# Patient Record
Sex: Female | Born: 1945 | Race: White | Hispanic: No | Smoking: Never smoker
Health system: Southern US, Community
[De-identification: ages and names within clinical notes are randomized; demographics above are authoritative.]

## PROBLEM LIST (undated history)

## (undated) DIAGNOSIS — E7800 Pure hypercholesterolemia, unspecified: Secondary | ICD-10-CM

## (undated) DIAGNOSIS — I10 Essential (primary) hypertension: Secondary | ICD-10-CM

## (undated) DIAGNOSIS — J45909 Unspecified asthma, uncomplicated: Secondary | ICD-10-CM

## (undated) DIAGNOSIS — E119 Type 2 diabetes mellitus without complications: Secondary | ICD-10-CM

## (undated) DIAGNOSIS — C50919 Malignant neoplasm of unspecified site of unspecified female breast: Secondary | ICD-10-CM

## (undated) DIAGNOSIS — Z808 Family history of malignant neoplasm of other organs or systems: Secondary | ICD-10-CM

## (undated) DIAGNOSIS — Z803 Family history of malignant neoplasm of breast: Secondary | ICD-10-CM

## (undated) DIAGNOSIS — Z801 Family history of malignant neoplasm of trachea, bronchus and lung: Secondary | ICD-10-CM

## (undated) DIAGNOSIS — Z800 Family history of malignant neoplasm of digestive organs: Secondary | ICD-10-CM

## (undated) DIAGNOSIS — M810 Age-related osteoporosis without current pathological fracture: Secondary | ICD-10-CM

## (undated) HISTORY — DX: Type 2 diabetes mellitus without complications: E11.9

## (undated) HISTORY — DX: Family history of malignant neoplasm of breast: Z80.3

## (undated) HISTORY — DX: Age-related osteoporosis without current pathological fracture: M81.0

## (undated) HISTORY — DX: Essential (primary) hypertension: I10

## (undated) HISTORY — DX: Family history of malignant neoplasm of digestive organs: Z80.0

## (undated) HISTORY — DX: Family history of malignant neoplasm of trachea, bronchus and lung: Z80.1

## (undated) HISTORY — PX: TONSILLECTOMY: SUR1361

## (undated) HISTORY — DX: Family history of malignant neoplasm of other organs or systems: Z80.8

## (undated) HISTORY — PX: MASTECTOMY: SHX3

---

## 1998-08-01 ENCOUNTER — Other Ambulatory Visit: Admission: RE | Admit: 1998-08-01 | Discharge: 1998-08-01 | Payer: Self-pay | Admitting: Obstetrics and Gynecology

## 1999-04-27 ENCOUNTER — Other Ambulatory Visit: Admission: RE | Admit: 1999-04-27 | Discharge: 1999-04-27 | Payer: Self-pay | Admitting: Obstetrics and Gynecology

## 2000-12-09 ENCOUNTER — Encounter: Payer: Self-pay | Admitting: Family Medicine

## 2000-12-09 ENCOUNTER — Ambulatory Visit (HOSPITAL_COMMUNITY): Admission: RE | Admit: 2000-12-09 | Discharge: 2000-12-09 | Payer: Self-pay | Admitting: Family Medicine

## 2001-01-01 ENCOUNTER — Encounter: Payer: Self-pay | Admitting: Obstetrics and Gynecology

## 2001-01-05 ENCOUNTER — Encounter (INDEPENDENT_AMBULATORY_CARE_PROVIDER_SITE_OTHER): Payer: Self-pay

## 2001-01-05 ENCOUNTER — Ambulatory Visit (HOSPITAL_COMMUNITY): Admission: RE | Admit: 2001-01-05 | Discharge: 2001-01-05 | Payer: Self-pay | Admitting: Obstetrics and Gynecology

## 2001-12-29 ENCOUNTER — Other Ambulatory Visit: Admission: RE | Admit: 2001-12-29 | Discharge: 2001-12-29 | Payer: Self-pay | Admitting: Obstetrics and Gynecology

## 2003-02-16 ENCOUNTER — Other Ambulatory Visit: Admission: RE | Admit: 2003-02-16 | Discharge: 2003-02-16 | Payer: Self-pay | Admitting: Obstetrics and Gynecology

## 2004-02-20 ENCOUNTER — Other Ambulatory Visit: Admission: RE | Admit: 2004-02-20 | Discharge: 2004-02-20 | Payer: Self-pay | Admitting: Obstetrics and Gynecology

## 2005-04-24 ENCOUNTER — Other Ambulatory Visit: Admission: RE | Admit: 2005-04-24 | Discharge: 2005-04-24 | Payer: Self-pay | Admitting: Obstetrics and Gynecology

## 2006-10-13 ENCOUNTER — Ambulatory Visit: Payer: Self-pay | Admitting: Oncology

## 2006-11-27 ENCOUNTER — Ambulatory Visit: Payer: Self-pay | Admitting: Oncology

## 2006-12-01 LAB — COMPREHENSIVE METABOLIC PANEL
CO2: 27 mEq/L (ref 19–32)
Creatinine, Ser: 1.19 mg/dL (ref 0.40–1.20)
Glucose, Bld: 91 mg/dL (ref 70–99)
Total Bilirubin: 0.4 mg/dL (ref 0.3–1.2)

## 2006-12-01 LAB — CBC WITH DIFFERENTIAL/PLATELET
BASO%: 0.4 % (ref 0.0–2.0)
Basophils Absolute: 0 10*3/uL (ref 0.0–0.1)
EOS%: 3.9 % (ref 0.0–7.0)
Eosinophils Absolute: 0.3 10*3/uL (ref 0.0–0.5)
HCT: 40.4 % (ref 34.8–46.6)
HGB: 14.4 g/dL (ref 11.6–15.9)
LYMPH%: 22.2 % (ref 14.0–48.0)
MONO#: 0.8 10*3/uL (ref 0.1–0.9)
MONO%: 10.4 % (ref 0.0–13.0)
NEUT#: 4.8 10*3/uL (ref 1.5–6.5)
NEUT%: 63.1 % (ref 39.6–76.8)
RDW: 12 % (ref 11.3–14.5)
WBC: 7.5 10*3/uL (ref 3.9–10.0)
lymph#: 1.7 10*3/uL (ref 0.9–3.3)

## 2006-12-01 LAB — LACTATE DEHYDROGENASE: LDH: 198 U/L (ref 94–250)

## 2006-12-09 ENCOUNTER — Emergency Department (HOSPITAL_COMMUNITY): Admission: EM | Admit: 2006-12-09 | Discharge: 2006-12-09 | Payer: Self-pay | Admitting: Emergency Medicine

## 2007-04-01 ENCOUNTER — Ambulatory Visit: Payer: Self-pay | Admitting: Oncology

## 2007-04-03 LAB — CBC WITH DIFFERENTIAL/PLATELET
BASO%: 0.5 % (ref 0.0–2.0)
Basophils Absolute: 0 10*3/uL (ref 0.0–0.1)
HCT: 40.2 % (ref 34.8–46.6)
HGB: 14.4 g/dL (ref 11.6–15.9)
MONO#: 0.6 10*3/uL (ref 0.1–0.9)
NEUT%: 60 % (ref 39.6–76.8)
WBC: 7 10*3/uL (ref 3.9–10.0)
lymph#: 1.8 10*3/uL (ref 0.9–3.3)

## 2007-04-03 LAB — LACTATE DEHYDROGENASE: LDH: 205 U/L (ref 94–250)

## 2007-04-03 LAB — MORPHOLOGY: PLT EST: ADEQUATE

## 2007-04-03 LAB — COMPREHENSIVE METABOLIC PANEL
ALT: 22 U/L (ref 0–35)
CO2: 21 mEq/L (ref 19–32)
Chloride: 107 mEq/L (ref 96–112)
Sodium: 142 mEq/L (ref 135–145)
Total Bilirubin: 0.4 mg/dL (ref 0.3–1.2)
Total Protein: 7.7 g/dL (ref 6.0–8.3)

## 2007-06-24 ENCOUNTER — Ambulatory Visit: Payer: Self-pay | Admitting: Oncology

## 2008-03-17 ENCOUNTER — Ambulatory Visit: Payer: Self-pay | Admitting: Oncology

## 2008-03-21 LAB — CBC WITH DIFFERENTIAL/PLATELET
BASO%: 0.3 % (ref 0.0–2.0)
Basophils Absolute: 0 10*3/uL (ref 0.0–0.1)
HCT: 41.8 % (ref 34.8–46.6)
HGB: 14.6 g/dL (ref 11.6–15.9)
LYMPH%: 22.9 % (ref 14.0–48.0)
MCH: 31.1 pg (ref 26.0–34.0)
MCHC: 34.9 g/dL (ref 32.0–36.0)
MONO#: 0.9 10*3/uL (ref 0.1–0.9)
NEUT%: 58.9 % (ref 39.6–76.8)
Platelets: 246 10*3/uL (ref 145–400)
WBC: 8.9 10*3/uL (ref 3.9–10.0)

## 2008-03-21 LAB — COMPREHENSIVE METABOLIC PANEL
ALT: 23 U/L (ref 0–35)
BUN: 25 mg/dL — ABNORMAL HIGH (ref 6–23)
CO2: 24 mEq/L (ref 19–32)
Calcium: 9.9 mg/dL (ref 8.4–10.5)
Creatinine, Ser: 0.8 mg/dL (ref 0.40–1.20)
Glucose, Bld: 87 mg/dL (ref 70–99)
Total Bilirubin: 0.4 mg/dL (ref 0.3–1.2)

## 2008-03-21 LAB — LACTATE DEHYDROGENASE: LDH: 212 U/L (ref 94–250)

## 2009-03-30 ENCOUNTER — Ambulatory Visit: Payer: Self-pay | Admitting: Oncology

## 2009-04-03 LAB — COMPREHENSIVE METABOLIC PANEL
ALT: 22 U/L (ref 0–35)
AST: 16 U/L (ref 0–37)
Albumin: 4.8 g/dL (ref 3.5–5.2)
Alkaline Phosphatase: 70 U/L (ref 39–117)
BUN: 17 mg/dL (ref 6–23)
CO2: 22 mEq/L (ref 19–32)
Calcium: 9.6 mg/dL (ref 8.4–10.5)
Chloride: 106 mEq/L (ref 96–112)
Creatinine, Ser: 0.7 mg/dL (ref 0.40–1.20)
Glucose, Bld: 98 mg/dL (ref 70–99)
Potassium: 4 mEq/L (ref 3.5–5.3)
Sodium: 141 mEq/L (ref 135–145)
Total Bilirubin: 0.3 mg/dL (ref 0.3–1.2)
Total Protein: 7.3 g/dL (ref 6.0–8.3)

## 2009-04-03 LAB — CBC WITH DIFFERENTIAL/PLATELET
BASO%: 0.4 % (ref 0.0–2.0)
Basophils Absolute: 0 10*3/uL (ref 0.0–0.1)
EOS%: 6.5 % (ref 0.0–7.0)
Eosinophils Absolute: 0.5 10*3/uL (ref 0.0–0.5)
HCT: 41.2 % (ref 34.8–46.6)
HGB: 14.2 g/dL (ref 11.6–15.9)
LYMPH%: 23.6 % (ref 14.0–49.7)
MCH: 30.9 pg (ref 25.1–34.0)
MCHC: 34.4 g/dL (ref 31.5–36.0)
MCV: 89.8 fL (ref 79.5–101.0)
MONO#: 0.7 10*3/uL (ref 0.1–0.9)
MONO%: 9.2 % (ref 0.0–14.0)
NEUT#: 4.5 10*3/uL (ref 1.5–6.5)
NEUT%: 60.3 % (ref 38.4–76.8)
Platelets: 248 10*3/uL (ref 145–400)
RBC: 4.59 10*6/uL (ref 3.70–5.45)
RDW: 11.9 % (ref 11.2–14.5)
WBC: 7.4 10*3/uL (ref 3.9–10.3)
lymph#: 1.8 10*3/uL (ref 0.9–3.3)

## 2010-04-25 ENCOUNTER — Ambulatory Visit: Payer: Self-pay | Admitting: Oncology

## 2010-04-27 LAB — COMPREHENSIVE METABOLIC PANEL
ALT: 25 U/L (ref 0–35)
AST: 18 U/L (ref 0–37)
Albumin: 4.8 g/dL (ref 3.5–5.2)
Alkaline Phosphatase: 78 U/L (ref 39–117)
BUN: 17 mg/dL (ref 6–23)
CO2: 20 mEq/L (ref 19–32)
Calcium: 10.1 mg/dL (ref 8.4–10.5)
Chloride: 106 mEq/L (ref 96–112)
Creatinine, Ser: 0.71 mg/dL (ref 0.40–1.20)
Glucose, Bld: 98 mg/dL (ref 70–99)
Potassium: 4.3 mEq/L (ref 3.5–5.3)
Sodium: 139 mEq/L (ref 135–145)
Total Bilirubin: 0.4 mg/dL (ref 0.3–1.2)
Total Protein: 7.1 g/dL (ref 6.0–8.3)

## 2010-04-27 LAB — CBC WITH DIFFERENTIAL/PLATELET
BASO%: 0.2 % (ref 0.0–2.0)
Basophils Absolute: 0 10*3/uL (ref 0.0–0.1)
EOS%: 4.8 % (ref 0.0–7.0)
Eosinophils Absolute: 0.3 10*3/uL (ref 0.0–0.5)
HCT: 40.5 % (ref 34.8–46.6)
HGB: 14.1 g/dL (ref 11.6–15.9)
LYMPH%: 23.3 % (ref 14.0–49.7)
MCH: 31.4 pg (ref 25.1–34.0)
MCHC: 34.9 g/dL (ref 31.5–36.0)
MCV: 90 fL (ref 79.5–101.0)
MONO#: 0.8 10*3/uL (ref 0.1–0.9)
MONO%: 12.6 % (ref 0.0–14.0)
NEUT#: 3.9 10*3/uL (ref 1.5–6.5)
NEUT%: 59.1 % (ref 38.4–76.8)
Platelets: 238 10*3/uL (ref 145–400)
RBC: 4.5 10*6/uL (ref 3.70–5.45)
RDW: 12.5 % (ref 11.2–14.5)
WBC: 6.6 10*3/uL (ref 3.9–10.3)
lymph#: 1.5 10*3/uL (ref 0.9–3.3)

## 2010-04-27 LAB — LACTATE DEHYDROGENASE: LDH: 202 U/L (ref 94–250)

## 2010-12-07 NOTE — Op Note (Signed)
[facility]  Patient:    Courtney Livingston, Courtney Livingston          MRN: [id number] Proc. Date: 01/05/01 Attending:  Trevor Iha, M.D.                           Operative Report  PREOPERATIVE DIAGNOSIS:       Postmenopausal bleeding and endometrial mass.  POSTOPERATIVE DIAGNOSIS:      Postmenopausal bleeding and endometrial mass.  OPERATION:                    Hysteroscopy dilatation and curettage.  SURGEON:                      Trevor Iha, M.D.  ANESTHESIA:                   Monitored anesthesia care and paracervical block.  ESTIMATED BLOOD LOSS:         Less than 10 cc.  SORBITOL DEFICIT:             Approximately 0.  INDICATIONS:                  [name] is a 65 year old postmenopausal white female on tamoxifen for breast cancer who presents after having one week of vaginal bleeding.  Ultrasound report shows thickened endometrial stripe and endometrial echogenic mass.  She presents today for hysteroscopy D&C for evaluation of this.  FINDINGS:                     Mildly thickened endometrial normal-appearing ostia on left and right, probable submucosal fibroid.  Otherwise normal-appearing cervix and endometrium after D&C.  DESCRIPTION OF PROCEDURE:     After adequate analgesia, the patient was placed in the dorsolithotomy position.  She was thoroughly prepped and draped.  The bladder was sterilely drained.  Graves speculum was placed.  Paracervical block was placed with 1% Xylocaine.  Tenaculum was placed on the anterior lip of the cervix.  The cervix was dilated up to a #27 tapered dilator. Hysteroscope was inserted.  The above findings were noted which is mostly mildly thickened endometrium.  Curettage was performed.  Reexamination at this point revealed minimal residual endometrium, bulging of the superior anterior fundus consistent with a submucosal fibroid.  No residual polyps were identified, and otherwise endometrial  lining appeared to be benign.  At this time, the hysteroscope was removed.  The tenaculum was removed from the anterior lip of the cervix.  Hemostasis was achieved with direct pressure and silver nitrate.  Speculum was then removed.  The patient tolerated the procedure well and was stable on transfer to the recovery room.  Sponge and instrument counts normal x 3.  Estimated blood loss for the procedure was less than 10 cc.  There was minimal sorbitol deficit.  DISPOSITION:  The patient is discharged to home for followup in the office in two to three weeks.  She was given routine instruction sheet for D&C. Discharge condition is good. DD:  01/05/01 TD:  01/05/01 Job: [id number]

## 2011-05-02 ENCOUNTER — Other Ambulatory Visit: Payer: Self-pay | Admitting: Oncology

## 2011-05-02 ENCOUNTER — Encounter (HOSPITAL_BASED_OUTPATIENT_CLINIC_OR_DEPARTMENT_OTHER): Payer: BC Managed Care – PPO | Admitting: Oncology

## 2011-05-02 DIAGNOSIS — C50419 Malignant neoplasm of upper-outer quadrant of unspecified female breast: Secondary | ICD-10-CM

## 2011-05-02 LAB — CBC WITH DIFFERENTIAL/PLATELET
BASO%: 0.3 % (ref 0.0–2.0)
EOS%: 3.2 % (ref 0.0–7.0)
HCT: 43.4 % (ref 34.8–46.6)
LYMPH%: 18.2 % (ref 14.0–49.7)
MCH: 31 pg (ref 25.1–34.0)
MCHC: 34.2 g/dL (ref 31.5–36.0)
MCV: 90.7 fL (ref 79.5–101.0)
NEUT%: 68.3 % (ref 38.4–76.8)
Platelets: 273 10*3/uL (ref 145–400)

## 2011-05-02 LAB — COMPREHENSIVE METABOLIC PANEL
ALT: 28 U/L (ref 0–35)
AST: 18 U/L (ref 0–37)
Creatinine, Ser: 1 mg/dL (ref 0.50–1.10)
Total Bilirubin: 0.2 mg/dL — ABNORMAL LOW (ref 0.3–1.2)

## 2011-05-02 LAB — LACTATE DEHYDROGENASE: LDH: 210 U/L (ref 94–250)

## 2011-05-14 ENCOUNTER — Encounter (HOSPITAL_BASED_OUTPATIENT_CLINIC_OR_DEPARTMENT_OTHER): Payer: BC Managed Care – PPO | Admitting: Oncology

## 2011-05-14 DIAGNOSIS — Z9010 Acquired absence of unspecified breast and nipple: Secondary | ICD-10-CM

## 2011-05-14 DIAGNOSIS — Z79811 Long term (current) use of aromatase inhibitors: Secondary | ICD-10-CM

## 2011-05-14 DIAGNOSIS — M810 Age-related osteoporosis without current pathological fracture: Secondary | ICD-10-CM

## 2011-05-14 DIAGNOSIS — Z853 Personal history of malignant neoplasm of breast: Secondary | ICD-10-CM

## 2012-01-16 ENCOUNTER — Encounter (HOSPITAL_COMMUNITY): Payer: Self-pay | Admitting: Emergency Medicine

## 2012-01-16 ENCOUNTER — Emergency Department (HOSPITAL_COMMUNITY)
Admission: EM | Admit: 2012-01-16 | Discharge: 2012-01-16 | Disposition: A | Discharge status: Home / Self Care | Payer: Medicare Other | Attending: Emergency Medicine | Admitting: Emergency Medicine

## 2012-01-16 DIAGNOSIS — Z9010 Acquired absence of unspecified breast and nipple: Secondary | ICD-10-CM | POA: Insufficient documentation

## 2012-01-16 DIAGNOSIS — Z853 Personal history of malignant neoplasm of breast: Secondary | ICD-10-CM | POA: Insufficient documentation

## 2012-01-16 DIAGNOSIS — H43392 Other vitreous opacities, left eye: Secondary | ICD-10-CM

## 2012-01-16 DIAGNOSIS — I10 Essential (primary) hypertension: Secondary | ICD-10-CM | POA: Insufficient documentation

## 2012-01-16 DIAGNOSIS — J45909 Unspecified asthma, uncomplicated: Secondary | ICD-10-CM | POA: Insufficient documentation

## 2012-01-16 DIAGNOSIS — H43399 Other vitreous opacities, unspecified eye: Secondary | ICD-10-CM | POA: Insufficient documentation

## 2012-01-16 DIAGNOSIS — E7800 Pure hypercholesterolemia, unspecified: Secondary | ICD-10-CM | POA: Insufficient documentation

## 2012-01-16 HISTORY — DX: Essential (primary) hypertension: I10

## 2012-01-16 HISTORY — DX: Unspecified asthma, uncomplicated: J45.909

## 2012-01-16 HISTORY — DX: Pure hypercholesterolemia, unspecified: E78.00

## 2012-01-16 HISTORY — DX: Malignant neoplasm of unspecified site of unspecified female breast: C50.919

## 2012-01-16 MED ORDER — CYCLOPENTOLATE HCL 1 % OP SOLN
2.0000 [drp] | Freq: Once | OPHTHALMIC | Status: AC
  Administered 2012-01-16: 2 [drp] via OPHTHALMIC
  Filled 2012-01-16: qty 2

## 2012-01-16 NOTE — ED Notes (Signed)
Pt states that she is due for new eyeglasses next week--has an appointment on Wednesday. Pt was wearing eyeglasses during visual acuity screening.

## 2012-01-16 NOTE — ED Notes (Signed)
Pt reports that she was seen by an optometrist today because of "left eye floater"--- her eye was dilated for exam, was discharged with info that floater will go away but if she sees flashes of light, she needs to seek emergency care. Pt states that she started seeing "bright flashes of light" at around 9pm tonight.

## 2012-01-16 NOTE — ED Provider Notes (Signed)
History     CSN: [id number]  Arrival date & time 01/16/12  0122   First MD Initiated Contact with Patient 01/16/12 [phone]      Chief Complaint  Patient presents with  . Eye Problem   HPI  History provided by the patient. Patient is a 66 year old female with history of hypertension hypercholesterolemia who presents with complaints of left eye vision changes. Patient states that she is having floaters across eye and vision with bright flashing lights to the upper left quadrant. Patient states that she had some similar symptoms earlier in the day and was seen by her ophthalmologist who examined her eyes and evaluated her retinas. At that time, it was felt there was no concerning cause of symptoms. Patient was instructed to return home and call or return for any worsening symptoms. This evening she reports having a new onset of the bright flashing lights to left upper quadrant of eye. She denies any pain to the eye. She denies any double vision or headache.     Past Medical History  Diagnosis Date  . Breast cancer   . Asthma   . Hypertension   . Hypercholesterolemia     Past Surgical History  Procedure Date  . Mastectomy   . Tonsillectomy     History reviewed. No pertinent family history.  History  Substance Use Topics  . Smoking status: Never Smoker   . Smokeless tobacco: Not on file  . Alcohol Use: No    OB History    Grav Para Term Preterm Abortions TAB SAB Ect Mult Living                  Review of Systems  Constitutional: Negative for fever and chills.  Eyes: Positive for visual disturbance. Negative for photophobia, pain, discharge and redness.  Neurological: Negative for dizziness, weakness, numbness and headaches.    Allergies  Benadryl  Home Medications   Current Outpatient Rx  Name Route Sig Dispense Refill  . ARIMIDEX PO Oral Take by mouth. PAtient reports 20mg  dosage a day but will call pharmacy to clarify when open.    Marland Kitchen LIPITOR PO Oral Take by  mouth.    Marland Kitchen CALCIUM CARBONATE-VITAMIN D 500-200 MG-UNIT PO TABS Oral Take 2 tablets by mouth daily.    Marland Kitchen LISINOPRIL PO Oral Take by mouth.    . OXYBUTYNIN CHLORIDE 5 MG PO TABS Oral Take 5 mg by mouth daily.      BP 118/48  Pulse 66  Temp 97.9 F (36.6 C) (Oral)  Resp 24  SpO2 97%  Physical Exam  Nursing note and vitals reviewed. Constitutional: She is oriented to person, place, and time. She appears well-developed and well-nourished. No distress.  HENT:  Head: Normocephalic.  Eyes: Conjunctivae and EOM are normal. Pupils are equal, round, and reactive to light. Right eye exhibits no discharge. Left eye exhibits no discharge.  Fundoscopic exam:      The left eye shows no arteriolar narrowing, no AV nicking and no hemorrhage.  Cardiovascular: Normal rate and regular rhythm.   No murmur heard. Pulmonary/Chest: Effort normal and breath sounds normal.  Neurological: She is alert and oriented to person, place, and time.  Skin: Skin is warm.  Psychiatric: She has a normal mood and affect. Her behavior is normal.    ED Course  Procedures     1. Flashers or floaters of left eye       MDM  3:10AM patient seen and evaluated. Patient in no acute  distress.   Patient was seen and evaluated with attending physician. No obvious concerns of retina on dilated exam. At this time we'll refer patient to ophthalmology for additional evaluation and treatment.     Angus Seller, [place] 01/16/12 [phone]

## 2012-01-16 NOTE — Discharge Instructions (Signed)
You were seen and evaluated today for your complaints at this time your providers did not find any evidence for concerning or emergent cause your symptoms. It is strongly recommended that you followup closely with ophthalmology specialist. Please followup with ophthalmology specialist for continued evaluation of your eye complaints of symptoms by calling for an appointment later today.     Eye Floaters A jelly-like fluid fills the inside of the eye and is called the vitreous. The vitreous is normally clear. It allows light to pass through to the back of the eye to the tissues that contain the nerves needed for vision (the retina). With age, the vitreous can start to decline. If a decline happens, specks of material from clumps of cells, blood, or other materials may start to float around inside the eye. These objects cast shadows on the retina. These shadows are seen as moving strings, streaks, "bugs," dust or spider webs floating in front of the eye. CAUSES   Age.   A high degree of near-sightedness (high myopia).   Tears in the retina.   Bleeding inside the eye from broken retinal blood vessels as a result of disease (diabetes, inflammation of the retinal blood vessels, and others).   Blood clot of the major vein of the retina or its branches (retinal vein occlusion).   Trauma.   Retinal detachment.   Vitreous detachment.   Eye surgery.   Inflammation inside the eye (uveitis).   Infection inside the eye.  SYMPTOMS   Seeing floating specs, dots or spider webs in the vision of one eye. This can sometimes be associated with flashes of light seen off to the side.   Bleeding in the eye may begin as floaters and lead to complete vision loss as the vitreous fills with blood. This may happen repeatedly in certain diseases of the blood vessels of the retina (e.g. diabetes).   If the vitreous shrinks enough to pull away from the retina (posterior vitreous detachment), a small circular  ring-shaped floater may be seen.  Migraine headaches may be associated with many forms of visual symptoms (sparkling dots, wavy lines) just before the headache strikes. These symptoms due to migraine are not from floaters. They will disappear when the headache goes away. DIAGNOSIS  An eye professional can tell you if you have floaters during an eye exam. TREATMENT  There is no treatment for the floaters themselves.  If the floaters are due to a tear in the retina, a retinal detachment or other eye disease, the condition that caused the floaters must be treated.   Floaters due to blood in the eye often go away or lessen with time.  SEEK MEDICAL CARE IF:   You suddenly see floating dots or spider webs in front of the vision of one or both eyes. This is especially true if you also see flashes of light off to the side (like flashes of lightening).   You see floaters and also notice a change or drop in your vision in either eye.  Document Released: 07/11/2003 Document Revised: 06/27/2011 Document Reviewed: 11/05/2007 [facility] Patient Information 2012 [place], [place].

## 2012-01-16 NOTE — ED Provider Notes (Signed)
Medical screening examination/treatment/procedure(s) were conducted as a shared visit with non-physician practitioner(s) and myself.  I personally evaluated the patient during the encounter  Sharp disc margins seen on funduscopy. A pigmented lesion consistent with a large floater is noted on the edge of the disc. No frank hemorrhage is seen.  Hanley Seamen, MD 01/16/12 [phone]

## 2012-01-16 NOTE — ED Notes (Signed)
Patient states that she started to have a "floater" in her eye today. The MD eye dr told her that at an evaluation today that all was ok  - however if she was to have flashes of lights or other problems with her eye that she come here to be reevaluated

## 2012-01-29 ENCOUNTER — Encounter (INDEPENDENT_AMBULATORY_CARE_PROVIDER_SITE_OTHER): Payer: Medicare Other | Admitting: Ophthalmology

## 2012-01-29 DIAGNOSIS — I10 Essential (primary) hypertension: Secondary | ICD-10-CM

## 2012-01-29 DIAGNOSIS — H35039 Hypertensive retinopathy, unspecified eye: Secondary | ICD-10-CM

## 2012-01-29 DIAGNOSIS — H2510 Age-related nuclear cataract, unspecified eye: Secondary | ICD-10-CM

## 2012-01-29 DIAGNOSIS — D3130 Benign neoplasm of unspecified choroid: Secondary | ICD-10-CM

## 2012-01-29 DIAGNOSIS — H43819 Vitreous degeneration, unspecified eye: Secondary | ICD-10-CM

## 2012-03-11 ENCOUNTER — Telehealth: Payer: Self-pay | Admitting: Oncology

## 2012-03-11 NOTE — Telephone Encounter (Signed)
lmonvm advising the pt of her oct and nov 2013 appts along with the mammo appat [facility]

## 2012-05-15 ENCOUNTER — Other Ambulatory Visit (HOSPITAL_BASED_OUTPATIENT_CLINIC_OR_DEPARTMENT_OTHER): Payer: Medicare Other | Admitting: Lab

## 2012-05-15 ENCOUNTER — Other Ambulatory Visit: Payer: Self-pay | Admitting: Oncology

## 2012-05-15 DIAGNOSIS — C50419 Malignant neoplasm of upper-outer quadrant of unspecified female breast: Secondary | ICD-10-CM

## 2012-05-15 LAB — COMPREHENSIVE METABOLIC PANEL (CC13)
AST: 23 U/L (ref 5–34)
BUN: 18 mg/dL (ref 7.0–26.0)
Calcium: 10.2 mg/dL (ref 8.4–10.4)
Chloride: 108 mEq/L — ABNORMAL HIGH (ref 98–107)
Creatinine: 0.8 mg/dL (ref 0.6–1.1)
Total Bilirubin: 0.4 mg/dL (ref 0.20–1.20)

## 2012-05-15 LAB — CBC WITH DIFFERENTIAL/PLATELET
Basophils Absolute: 0 10*3/uL (ref 0.0–0.1)
EOS%: 4.6 % (ref 0.0–7.0)
HCT: 41.4 % (ref 34.8–46.6)
HGB: 14.1 g/dL (ref 11.6–15.9)
LYMPH%: 12.8 % — ABNORMAL LOW (ref 14.0–49.7)
MCH: 31 pg (ref 25.1–34.0)
MCV: 90.8 fL (ref 79.5–101.0)
NEUT%: 71.6 % (ref 38.4–76.8)
Platelets: 215 10*3/uL (ref 145–400)
lymph#: 1 10*3/uL (ref 0.9–3.3)

## 2012-05-15 LAB — LACTATE DEHYDROGENASE (CC13): LDH: 207 U/L (ref 125–220)

## 2012-05-22 ENCOUNTER — Encounter: Payer: Self-pay | Admitting: Oncology

## 2012-05-22 ENCOUNTER — Telehealth: Payer: Self-pay | Admitting: Oncology

## 2012-05-22 ENCOUNTER — Ambulatory Visit (HOSPITAL_BASED_OUTPATIENT_CLINIC_OR_DEPARTMENT_OTHER): Payer: Medicare Other | Admitting: Oncology

## 2012-05-22 VITALS — BP 133/74 | HR 87 | Temp 98.2°F | Resp 20 | Ht 60.0 in | Wt 168.5 lb

## 2012-05-22 DIAGNOSIS — M810 Age-related osteoporosis without current pathological fracture: Secondary | ICD-10-CM | POA: Insufficient documentation

## 2012-05-22 DIAGNOSIS — C50919 Malignant neoplasm of unspecified site of unspecified female breast: Secondary | ICD-10-CM

## 2012-05-22 DIAGNOSIS — N6019 Diffuse cystic mastopathy of unspecified breast: Secondary | ICD-10-CM

## 2012-05-22 DIAGNOSIS — Z853 Personal history of malignant neoplasm of breast: Secondary | ICD-10-CM

## 2012-05-22 DIAGNOSIS — I10 Essential (primary) hypertension: Secondary | ICD-10-CM

## 2012-05-22 HISTORY — DX: Essential (primary) hypertension: I10

## 2012-05-22 HISTORY — DX: Age-related osteoporosis without current pathological fracture: M81.0

## 2012-05-22 NOTE — Progress Notes (Signed)
Hematology and Oncology Follow Up Visit  Courtney Livingston [id number] [birth year] 66 y.o. 05/22/2012 6:35 PM   Principle Diagnosis: Encounter Diagnoses  Name Primary?  . Breast cancer, stage 2 Yes  . HTN (hypertension), benign   . 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 She has a history of fibrocystic disease of the breast.  She had benign biopsies of the right breast in 1968 and of the left breast in 1971.  She developed an invasive lobular carcinoma of the right breast in June 1996.  She underwent a right modified radical mastectomy on 01/07/95 by Dr. Adline Mango in [place], [place].  Primary tumor was 3.2 cm, 1/12 lymph nodes was positive.  No vascular or lymphatic invasion.  Estrogen receptor less than 10%, progesterone receptor positive 70%.  HER-2 was not overexpressed.  A DNA index was 2.07.  Following recovery from surgery, she received four cycles of Adriamycin/Cytoxan chemotherapy in standard doses through October of 1996.  She then received five years of tamoxifen.   At time of her first visit with me in May of 2008 she was doing well with no evidence for recurrent disease,  off all hormonal therapy for seven years and out 12 years from her initial diagnosis.  She had been reading a lot about the benefits of aromatase inhibitors and wondered if she would be a candidate to start one?  I saw no contraindication despite the break in hormone treatment and I started her on Arimidex 1 mg daily. She has tolerated the drug well except for some weight gain. She is now been on the Arimidex for 5 years.  She's had no interim medical problems. She denies any headache or change in vision, no bone pain, no change in bowel habits, no vaginal bleeding.  Medications: reviewed  Allergies:  Allergies  Allergen Reactions  . Other Other (See Comments)    Seasonal allergies/stuffiness,runny nose etc.  . Benadryl (Diphenhydramine Hcl)     Restless legs    Review of Systems: Constitutional:   No constitutional symptoms Respiratory: No cough or dyspnea Cardiovascular: No chest pain or palpitations  Gastrointestinal: See above Genito-Urinary: See above Musculoskeletal: See above Neurologic: See above Skin: No rash Remaining ROS negative.  Physical Exam: Blood pressure 133/74, pulse 87, temperature 98.2 F (36.8 C), temperature source Oral, resp. rate 20, height 5' (1.524 m), weight 168 lb 8 oz (76.431 kg). Wt Readings from Last 3 Encounters:  05/22/12 168 lb 8 oz (76.431 kg)     General appearance: Well-nourished Caucasian woman HENNT: Pharynx no erythema or exudate Lymph nodes: No adenopathy Breasts: Right mastectomy, no chest wall lesions no left breast mass. Scar around the areolar area from previous biopsies Lungs: Clear to auscultation resonant to percussion Heart: Regular rhythm no murmur Abdomen: Soft nontender no mass no organomegaly Extremities: No edema no calf tenderness Vascular: No cyanosis Neurologic: No focal deficit Skin: No rash or ecchymosis  Lab Results: Lab Results  Component Value Date   WBC 8.1 05/15/2012   HGB 14.1 05/15/2012   HCT 41.4 05/15/2012   MCV 90.8 05/15/2012   PLT 215 05/15/2012     Chemistry      Component Value Date/Time   NA 144 05/15/2012 1129   NA 138 05/02/2011 1600   K 4.3 05/15/2012 1129   K 3.9 05/02/2011 1600   CL 108* 05/15/2012 1129   CL 102 05/02/2011 1600   CO2 25 05/15/2012 1129   CO2 27  05/02/2011 1600   BUN 18.0 05/15/2012 1129   BUN 22 05/02/2011 1600   CREATININE 0.8 05/15/2012 1129   CREATININE 1.00 05/02/2011 1600      Component Value Date/Time   CALCIUM 10.2 05/15/2012 1129   CALCIUM 10.0 05/02/2011 1600   ALKPHOS 78 05/15/2012 1129   ALKPHOS 87 05/02/2011 1600   AST 23 05/15/2012 1129   AST 18 05/02/2011 1600   ALT 45 05/15/2012 1129   ALT 28 05/02/2011 1600   BILITOT 0.40 05/15/2012 1129   BILITOT 0.2* 05/02/2011 1600        Radiological Studies: Most recent mammogram done 03/27/2012 on the left breast shows no new abnormalities. Dense breast tissue.  Impression and Plan: #1. Stage II, T2, N1, ER negative, PR positive, cancer of the right breast treated as outlined above. She remains free of any obvious new disease now out 17 years from diagnosis. She's now had a total of 10 years of hormonal therapy although there was a treatment break. Current recommendation by our professional society for aromatase inhibitors is to discontinue at 5 years so I will stop her Arimidex at this time. She would like to see me on an annual basis. I am no longer going to get routine laboratory studies but I will schedule her for her mammogram for next year  #2. Hypertension  #3. Hyperlipidemia  #4. Fibrocystic disease of the breast  #5. Seasonal rhinitis  #6. Osteoporosis   CC:. Dr. Laurann Montana; Dr. Candice Camp   Levert Feinstein, MD 11/1/20136:35 PM

## 2012-05-22 NOTE — Telephone Encounter (Signed)
Faxed orders to [facility]  

## 2012-05-22 NOTE — Telephone Encounter (Signed)
Scheduled mammo with [facility] at [facility] and received appt 09.8.14 @ 3:00....gv and printed appt schedule for pt.

## 2013-01-28 ENCOUNTER — Ambulatory Visit (INDEPENDENT_AMBULATORY_CARE_PROVIDER_SITE_OTHER): Payer: 59 | Admitting: Ophthalmology

## 2013-01-28 DIAGNOSIS — E11319 Type 2 diabetes mellitus with unspecified diabetic retinopathy without macular edema: Secondary | ICD-10-CM

## 2013-01-28 DIAGNOSIS — D3130 Benign neoplasm of unspecified choroid: Secondary | ICD-10-CM

## 2013-01-28 DIAGNOSIS — H35039 Hypertensive retinopathy, unspecified eye: Secondary | ICD-10-CM

## 2013-01-28 DIAGNOSIS — E1139 Type 2 diabetes mellitus with other diabetic ophthalmic complication: Secondary | ICD-10-CM

## 2013-01-28 DIAGNOSIS — I10 Essential (primary) hypertension: Secondary | ICD-10-CM

## 2013-04-16 ENCOUNTER — Encounter: Payer: Self-pay | Admitting: Oncology

## 2013-05-31 ENCOUNTER — Ambulatory Visit (HOSPITAL_BASED_OUTPATIENT_CLINIC_OR_DEPARTMENT_OTHER): Payer: Medicare Other | Admitting: Oncology

## 2013-05-31 VITALS — BP 158/65 | HR 66 | Temp 98.8°F | Resp 18 | Ht 60.0 in | Wt 171.8 lb

## 2013-05-31 DIAGNOSIS — M899 Disorder of bone, unspecified: Secondary | ICD-10-CM

## 2013-05-31 DIAGNOSIS — I10 Essential (primary) hypertension: Secondary | ICD-10-CM

## 2013-05-31 DIAGNOSIS — C50911 Malignant neoplasm of unspecified site of right female breast: Secondary | ICD-10-CM

## 2013-05-31 DIAGNOSIS — Z853 Personal history of malignant neoplasm of breast: Secondary | ICD-10-CM

## 2013-06-02 NOTE — Progress Notes (Signed)
Hematology and Oncology Follow Up Visit  Courtney Livingston [id number] 1946/06/27 67 y.o. 06/02/2013 8:02 PM   Principle Diagnosis:No diagnosis found.   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 She has a history of fibrocystic disease of the breast. She had benign biopsies of the right breast in 1968 and of the left breast in 1971. She developed an invasive lobular carcinoma of the right breast in June 1996. She underwent a right modified radical mastectomy on 01/07/95 by Dr. Adline Mango in [place], [place]. Primary tumor was 3.2 cm, 1 out of 12 lymph nodes was positive. No vascular or lymphatic invasion. Estrogen receptor less than 10%, progesterone receptor positive 70%. HER-2 was not overexpressed. A DNA index was 2.07. Following recovery from surgery, she received four cycles of Adriamycin/Cytoxan chemotherapy in standard doses through October of 1996. She then received five years of tamoxifen.  At time of her first visit with me in May of 2008 she was doing well with no evidence for recurrent disease, off all hormonal therapy for seven years and out 12 years from her initial diagnosis. She had been reading a lot about the benefits of aromatase inhibitors and wondered if she would be a candidate to start one? I saw no contraindication despite the break in hormone treatment and I started her on Arimidex 1 mg daily she 5 views through 2013.  She is doing well at this time. She has had no interim medical problems. She denies any headache or change in vision, no bone pain, no vaginal bleeding, no change in bowel habit. Left mammogram done 03/29/2013 shows no new disease.  Medications: reviewed  Allergies:  Allergies  Allergen Reactions  . Other Other (See Comments)    Seasonal allergies/stuffiness,runny nose etc.  . Benadryl [Diphenhydramine Hcl]     Restless legs    Review of Systems: ENT ROS: No sore throat Respiratory ROS no cough or  dyspnea Cardiovascular ROS:   No chest pain or palpitations Gastrointestinal ROS:   See above Genito-Urinary ROS: See above Musculoskeletal ROS: See above Neurological ROS: See above Dermatological ROS: No rash or ecchymosis Remaining ROS negative.  Physical Exam: Blood pressure 158/65, pulse 66, temperature 98.8 F (37.1 C), temperature source Oral, resp. rate 18, height 5' (1.524 m), weight 171 lb 12.8 oz (77.928 kg), SpO2 98.00%. Wt Readings from Last 3 Encounters:  05/31/13 171 lb 12.8 oz (77.928 kg)  05/22/12 168 lb 8 oz (76.431 kg)     General appearance: Well-nourished Caucasian woman HENNT: Pharynx no erythema, exudate, mass, or ulcer. No thyromegaly or thyroid nodules Lymph nodes: No cervical, supraclavicular, or axillary lymphadenopathy Breasts: Right mastectomy. No chest wall lesions. No left breast masses. Scar from previous Port-A-Cath.mec Lungs: Clear to auscultation, resonant to percussion throughout Heart: Regular rhythm, no murmur, no gallop, no rub, no click, no edema Abdomen: Soft, nontender, normal bowel sounds, no mass, no organomegaly Extremities: No edema, no calf tenderness Musculoskeletal: no joint deformities GU:  Vascular: Carotid pulses 2+, no bruits, Neurologic: Alert, oriented, PERRLA, , cranial nerves grossly normal, motor strength 5 over 5, reflexes 1+ symmetric, upper body coordination normal, gait normal, Skin: No rash or ecchymosis  Lab Results: CBC W/Diff    Component Value Date/Time   WBC 8.1 05/15/2012 1129   RBC 4.55 05/15/2012 1129   HGB 14.1 05/15/2012 1129   HCT 41.4 05/15/2012 1129   PLT 215 05/15/2012 1129   MCV 90.8 05/15/2012 1129   MCH 31.0 05/15/2012 1129   MCH 31.4  04/27/2010 1349   MCHC 34.1 05/15/2012 1129   RDW 12.1 05/15/2012 1129   LYMPHSABS 1.0 05/15/2012 1129   MONOABS 0.9 05/15/2012 1129   EOSABS 0.4 05/15/2012 1129   BASOSABS 0.0 05/15/2012 1129     Chemistry      Component Value Date/Time   NA 144  05/15/2012 1129   NA 138 05/02/2011 1600   K 4.3 05/15/2012 1129   K 3.9 05/02/2011 1600   CL 108* 05/15/2012 1129   CL 102 05/02/2011 1600   CO2 25 05/15/2012 1129   CO2 27 05/02/2011 1600   BUN 18.0 05/15/2012 1129   BUN 22 05/02/2011 1600   CREATININE 0.8 05/15/2012 1129   CREATININE 1.00 05/02/2011 1600      Component Value Date/Time   CALCIUM 10.2 05/15/2012 1129   CALCIUM 10.0 05/02/2011 1600   ALKPHOS 78 05/15/2012 1129   ALKPHOS 87 05/02/2011 1600   AST 23 05/15/2012 1129   AST 18 05/02/2011 1600   ALT 45 05/15/2012 1129   ALT 28 05/02/2011 1600   BILITOT 0.40 05/15/2012 1129   BILITOT 0.2* 05/02/2011 1600       Radiological Studies: No results found.   Impression:  #1. Stage II, T2, N1, ER negative, PR positive, cancer of the right breast treated as outlined above. She remains free of any obvious new disease now out 18 years from diagnosis.  She's had a total of 10 years of hormonal therapy although there was a treatment break.  I told her that I felt comfortable letting her graduate from our practice at this time. We would be happy to see her again in the future if things change.   #2. Hypertension  #3. Hyperlipidemia  #4. Fibrocystic disease of the breast  #5. Seasonal rhinitis  #6. Osteoporosis   CC: Patient Care Team: Cala Bradford, MD as PCP - General (Family Medicine)   Levert Feinstein, MD 11/12/20148:02 PM

## 2013-09-18 ENCOUNTER — Encounter: Payer: Self-pay | Admitting: Oncology

## 2013-09-18 ENCOUNTER — Telehealth: Payer: Self-pay | Admitting: Oncology

## 2013-09-18 NOTE — Telephone Encounter (Signed)
, °

## 2013-10-15 ENCOUNTER — Telehealth: Payer: Self-pay | Admitting: Oncology

## 2013-10-15 NOTE — Telephone Encounter (Signed)
Former patient of Dr. Darnell Level reassigned to Dr. Alen Blew.

## 2014-02-02 ENCOUNTER — Ambulatory Visit (INDEPENDENT_AMBULATORY_CARE_PROVIDER_SITE_OTHER): Payer: 59 | Admitting: Ophthalmology

## 2014-02-09 ENCOUNTER — Ambulatory Visit (INDEPENDENT_AMBULATORY_CARE_PROVIDER_SITE_OTHER): Payer: 59 | Admitting: Ophthalmology

## 2014-03-18 ENCOUNTER — Ambulatory Visit (INDEPENDENT_AMBULATORY_CARE_PROVIDER_SITE_OTHER): Payer: 59 | Admitting: Ophthalmology

## 2014-03-18 DIAGNOSIS — I10 Essential (primary) hypertension: Secondary | ICD-10-CM

## 2014-03-18 DIAGNOSIS — H35039 Hypertensive retinopathy, unspecified eye: Secondary | ICD-10-CM

## 2014-03-18 DIAGNOSIS — D3130 Benign neoplasm of unspecified choroid: Secondary | ICD-10-CM

## 2014-03-18 DIAGNOSIS — E11319 Type 2 diabetes mellitus with unspecified diabetic retinopathy without macular edema: Secondary | ICD-10-CM

## 2014-03-18 DIAGNOSIS — E1139 Type 2 diabetes mellitus with other diabetic ophthalmic complication: Secondary | ICD-10-CM

## 2014-03-18 DIAGNOSIS — E1165 Type 2 diabetes mellitus with hyperglycemia: Secondary | ICD-10-CM

## 2014-03-18 DIAGNOSIS — H43819 Vitreous degeneration, unspecified eye: Secondary | ICD-10-CM

## 2014-03-18 DIAGNOSIS — H2510 Age-related nuclear cataract, unspecified eye: Secondary | ICD-10-CM

## 2015-01-31 ENCOUNTER — Encounter: Payer: Self-pay | Admitting: Genetic Counselor

## 2015-03-31 ENCOUNTER — Ambulatory Visit (INDEPENDENT_AMBULATORY_CARE_PROVIDER_SITE_OTHER): Payer: 59 | Admitting: Ophthalmology

## 2016-01-12 ENCOUNTER — Encounter: Payer: Self-pay | Admitting: Genetic Counselor

## 2016-10-17 ENCOUNTER — Ambulatory Visit (INDEPENDENT_AMBULATORY_CARE_PROVIDER_SITE_OTHER): Payer: Medicare Other | Admitting: Ophthalmology

## 2016-10-17 DIAGNOSIS — H35033 Hypertensive retinopathy, bilateral: Secondary | ICD-10-CM

## 2016-10-17 DIAGNOSIS — D3131 Benign neoplasm of right choroid: Secondary | ICD-10-CM | POA: Diagnosis not present

## 2016-10-17 DIAGNOSIS — I10 Essential (primary) hypertension: Secondary | ICD-10-CM

## 2016-10-17 DIAGNOSIS — H43813 Vitreous degeneration, bilateral: Secondary | ICD-10-CM

## 2017-10-17 ENCOUNTER — Ambulatory Visit (INDEPENDENT_AMBULATORY_CARE_PROVIDER_SITE_OTHER): Payer: Medicare Other | Admitting: Ophthalmology

## 2018-05-08 ENCOUNTER — Ambulatory Visit: Payer: Self-pay | Admitting: *Deleted

## 2018-05-14 ENCOUNTER — Encounter: Payer: Medicare Other | Attending: Family Medicine | Admitting: Registered"

## 2018-05-14 ENCOUNTER — Encounter: Payer: Self-pay | Admitting: Registered"

## 2018-05-14 DIAGNOSIS — E1169 Type 2 diabetes mellitus with other specified complication: Secondary | ICD-10-CM | POA: Insufficient documentation

## 2018-05-14 DIAGNOSIS — E119 Type 2 diabetes mellitus without complications: Secondary | ICD-10-CM

## 2018-05-14 DIAGNOSIS — Z713 Dietary counseling and surveillance: Secondary | ICD-10-CM | POA: Diagnosis not present

## 2018-05-14 NOTE — Progress Notes (Signed)
Diabetes Self-Management Education  Visit Type:  First/Initial  Appt. Start Time: 2:47 Appt. End Time: 4:00  05/14/2018  Courtney Livingston, identified by name and date of birth, is a 72 y.o. female with a diagnosis of Diabetes: Type 2.   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 Pt states her husband had diabetes and familiar with some information. Pt states states she hates breakfast and loves vegetables. Pt states she owns an an exercise bike at home and recently purchased new exercise equipment (something similar to an elliptical). Pt states she walks her dog often. Pt states one daughter. Pt states it is so hard for her not to eat after dinner.   Pt was given Contour Next glucometer Lot # [id number] Exp: 03/22/2019 Check blood sugar in office: 148 (non-fasting)  There were no vitals taken for this visit. There is no height or weight on file to calculate BMI.   Diabetes Self-Management Education - 05/14/18 1451      Initial Visit   What type of meal plan do you follow?  modified mediterranean      Health Coping   How would you rate your overall health?  Good      Psychosocial Assessment   Patient Belief/Attitude about Diabetes  Motivated to manage diabetes    Self-care barriers  None    Self-management support  Family;Friends;Church    Patient Concerns  Nutrition/Meal planning    Special Needs  None    Preferred Learning Style  No preference indicated    Learning Readiness  Change in progress      Complications   Last HgB A1C per patient/outside source  7.1 %    How often do you check your blood sugar?  0 times/day (not testing)    Number of hypoglycemic episodes per month  0    Number of hyperglycemic episodes per week  0    Have you had a dilated eye exam in the past 12 months?  Yes    Have  you had a dental exam in the past 12 months?  Yes    Are you checking your feet?  Yes    How many days per week are you checking your feet?  7      Dietary Intake   Breakfast  1/2 oatmeal + organic whole milk or instant oatmeal or yogurt or smoothie     Snack (morning)  1/2 oatmeal or 1/2 salad or cheese + bread    Lunch  typically skips    Snack (afternoon)  1/2 oatmeal    Dinner  chicken/beef/pork/fish (3-4 days/week) + vegetables + salad     Snack (evening)  yogurt, milk    Beverage(s)  organic whole milk, 2 c of coffee, herbal tea, sparkling water      Exercise   Exercise Type  Light (walking / raking leaves)    How many days per week to you exercise?  1    How many minutes per day do you exercise?  20    Total minutes per week of exercise  20      Patient Education   Previous Diabetes Education  Yes (please comment)    Disease state   Definition of diabetes, type 1 and 2, and the diagnosis of diabetes    Nutrition management   Role of  diet in the treatment of diabetes and the relationship between the three main macronutrients and blood glucose level;Reviewed blood glucose goals for pre and post meals and how to evaluate the patients' food intake on their blood glucose level.;Effects of alcohol on blood glucose and safety factors with consumption of alcohol.    Physical activity and exercise   Role of exercise on diabetes management, blood pressure control and cardiac health.    Monitoring  Taught/evaluated SMBG meter.;Purpose and frequency of SMBG.;Taught/discussed recording of test results and interpretation of SMBG.;Interpreting lab values - A1C, lipid, urine microalbumina.;Yearly dilated eye exam;Daily foot exams;Identified appropriate SMBG and/or A1C goals.    Acute complications  Taught treatment of hypoglycemia - the 15 rule.;Discussed and identified patients' treatment of hyperglycemia.    Chronic complications  Assessed and discussed foot care and prevention of foot  problems;Relationship between chronic complications and blood glucose control;Lipid levels, blood glucose control and heart disease;Dental care;Nephropathy, what it is, prevention of, the use of ACE, ARB's and early detection of through urine microalbumia.;Identified and discussed with patient  current chronic complications;Retinopathy and reason for yearly dilated eye exams;Reviewed with patient heart disease, higher risk of, and prevention    Psychosocial adjustment  Role of stress on diabetes    Personal strategies to promote health  Lifestyle issues that need to be addressed for better diabetes care      Individualized Goals (developed by patient)   Nutrition  Follow meal plan discussed;General guidelines for healthy choices and portions discussed    Physical Activity  Exercise 3-5 times per week;15 minutes per day;30 minutes per day    Medications  take my medication as prescribed    Monitoring   test my blood glucose as discussed;send in my blood glucose log as discussed    Reducing Risk  do foot checks daily;treat hypoglycemia with 15 grams of carbs if blood glucose less than 70mg /dL    Health Coping  Not Applicable      Post-Education Assessment   Patient understands the diabetes disease and treatment process.  Demonstrates understanding / competency    Patient understands incorporating nutritional management into lifestyle.  Demonstrates understanding / competency    Patient undertands incorporating physical activity into lifestyle.  Demonstrates understanding / competency    Patient understands using medications safely.  Demonstrates understanding / competency    Patient understands monitoring blood glucose, interpreting and using results  Demonstrates understanding / competency    Patient understands prevention, detection, and treatment of acute complications.  Demonstrates understanding / competency    Patient understands prevention, detection, and treatment of chronic complications.   Demonstrates understanding / competency    Patient understands how to develop strategies to address psychosocial issues.  Demonstrates understanding / competency    Patient understands how to develop strategies to promote health/change behavior.  Demonstrates understanding / competency      Outcomes   Program Status  Completed       Learning Objective:  Patient will have a greater understanding of diabetes self-management. Patient education plan is to attend individual and/or group sessions per assessed needs and concerns.   Plan:   Patient Instructions  Goals:  Follow Diabetes Meal Plan as instructed  Eat 3 meals and 2 snacks, every 3-5 hrs  Limit carbohydrate intake to 30-45 grams carbohydrate/meal  Limit carbohydrate intake to 15-30 grams carbohydrate/snack  Add lean protein foods to meals/snacks  Monitor glucose levels as instructed by your doctor  Aim for 20 mins of physical activity at least  3 days a week  Bring food record and glucose log to your next nutrition visit  Check blood sugars at least 3-4 times a day     Expected Outcomes:  Demonstrated interest in learning. Expect positive outcomes  Education material provided: ADA Diabetes: Your Take Control Guide  If problems or questions, patient to contact team via:  Phone and Email  Future DSME appointment: - Yearly

## 2018-05-14 NOTE — Patient Instructions (Signed)
Goals:  Follow Diabetes Meal Plan as instructed  Eat 3 meals and 2 snacks, every 3-5 hrs  Limit carbohydrate intake to 30-45 grams carbohydrate/meal  Limit carbohydrate intake to 15-30 grams carbohydrate/snack  Add lean protein foods to meals/snacks  Monitor glucose levels as instructed by your doctor  Aim for 20 mins of physical activity at least 3 days a week  Bring food record and glucose log to your next nutrition visit  Check blood sugars at least 3-4 times a day

## 2018-09-14 ENCOUNTER — Ambulatory Visit: Payer: Medicare Other | Admitting: Podiatry

## 2018-09-21 ENCOUNTER — Encounter: Payer: Medicare Other | Admitting: Podiatry

## 2018-09-26 NOTE — Progress Notes (Signed)
This encounter was created in error - please disregard.

## 2018-10-21 ENCOUNTER — Ambulatory Visit: Payer: Medicare Other | Admitting: Podiatry

## 2019-03-08 ENCOUNTER — Other Ambulatory Visit: Payer: Self-pay | Admitting: Radiology

## 2019-08-29 ENCOUNTER — Ambulatory Visit: Payer: Medicare PPO | Attending: Internal Medicine

## 2019-08-29 DIAGNOSIS — Z23 Encounter for immunization: Secondary | ICD-10-CM | POA: Insufficient documentation

## 2019-08-29 NOTE — Progress Notes (Signed)
   Covid-19 Vaccination Clinic  Name:  Courtney Livingston    MRN: [id number] DOB: July 05, 1946  08/29/2019  Courtney Livingston was observed post Covid-19 immunization for 15 minutes without incidence. She was provided with Vaccine Information Sheet and instruction to access the V-Safe system.   Courtney Livingston was instructed to call 911 with any severe reactions post vaccine: Marland Kitchen Difficulty breathing  . Swelling of your face and throat  . A fast heartbeat  . A bad rash all over your body  . Dizziness and weakness    Immunizations Administered    Name Date Dose VIS Date Route   Pfizer COVID-19 Vaccine 08/29/2019  5:27 PM 0.3 mL 07/02/2019 Intramuscular   Manufacturer: [place]   Lot: [id number]   [place]: [id number]

## 2019-09-19 ENCOUNTER — Ambulatory Visit: Payer: Medicare Other

## 2019-09-23 ENCOUNTER — Ambulatory Visit: Payer: Medicare PPO | Attending: Internal Medicine

## 2019-09-23 DIAGNOSIS — Z23 Encounter for immunization: Secondary | ICD-10-CM | POA: Insufficient documentation

## 2019-09-23 NOTE — Progress Notes (Signed)
   Covid-19 Vaccination Clinic  Name:  Shiri Vanhuss    MRN: [id number] DOB: December 27, 1945  09/23/2019  Ms. Chambers-Merriman was observed post Covid-19 immunization for 15 minutes without incident. She was provided with Vaccine Information Sheet and instruction to access the V-Safe system.   Ms. Behnken was instructed to call 911 with any severe reactions post vaccine: Marland Kitchen Difficulty breathing  . Swelling of face and throat  . A fast heartbeat  . A bad rash all over body  . Dizziness and weakness   Immunizations Administered    Name Date Dose VIS Date Route   Pfizer COVID-19 Vaccine 09/23/2019  2:07 PM 0.3 mL 07/02/2019 Intramuscular   Manufacturer: [place]   Lot: [id number]   [place]: [id number]

## 2020-03-02 ENCOUNTER — Encounter: Payer: Self-pay | Admitting: Genetic Counselor

## 2020-03-21 ENCOUNTER — Inpatient Hospital Stay: Payer: Medicare PPO | Attending: Ophthalmology | Admitting: Genetic Counselor

## 2020-03-21 DIAGNOSIS — Z808 Family history of malignant neoplasm of other organs or systems: Secondary | ICD-10-CM | POA: Diagnosis not present

## 2020-03-21 DIAGNOSIS — Z800 Family history of malignant neoplasm of digestive organs: Secondary | ICD-10-CM

## 2020-03-21 DIAGNOSIS — Z803 Family history of malignant neoplasm of breast: Secondary | ICD-10-CM | POA: Diagnosis not present

## 2020-03-21 DIAGNOSIS — Z801 Family history of malignant neoplasm of trachea, bronchus and lung: Secondary | ICD-10-CM

## 2020-03-21 DIAGNOSIS — C50911 Malignant neoplasm of unspecified site of right female breast: Secondary | ICD-10-CM

## 2020-03-22 ENCOUNTER — Telehealth: Payer: Self-pay | Admitting: Genetic Counselor

## 2020-03-22 ENCOUNTER — Encounter: Payer: Self-pay | Admitting: Genetic Counselor

## 2020-03-22 DIAGNOSIS — Z801 Family history of malignant neoplasm of trachea, bronchus and lung: Secondary | ICD-10-CM | POA: Insufficient documentation

## 2020-03-22 DIAGNOSIS — Z808 Family history of malignant neoplasm of other organs or systems: Secondary | ICD-10-CM | POA: Insufficient documentation

## 2020-03-22 DIAGNOSIS — Z803 Family history of malignant neoplasm of breast: Secondary | ICD-10-CM | POA: Insufficient documentation

## 2020-03-22 DIAGNOSIS — Z800 Family history of malignant neoplasm of digestive organs: Secondary | ICD-10-CM | POA: Insufficient documentation

## 2020-03-22 NOTE — Progress Notes (Signed)
REFERRING PROVIDER: No referring provider defined for this encounter.  PRIMARY PROVIDER:  Harlan Stains, MD  PRIMARY REASON FOR VISIT:  1. Malignant neoplasm of right breast, stage 2 ([place])   2. Family history of breast cancer   3. Family history of pancreatic cancer   4. Family history of colon cancer   5. Family history of adrenal cancer   6. Family history of lung cancer      I connected with Courtney Livingston on 03/21/2020 at 11:00 am EDT by video conference and verified that I am speaking with the correct person using two identifiers.   Patient location: Home Provider location: [facility] office  HISTORY OF PRESENT ILLNESS:   Courtney Livingston, a 74 y.o. female, was seen for a [facility] cancer genetics consultation due to a personal and family history of cancer and discussion of updated genetic testing.  Ms. Cancio presents to clinic today to discuss the possibility of a hereditary predisposition to cancer, genetic testing, and to further clarify her future cancer risks, as well as potential cancer risks for family members.   In June of 1996, at the age of 89, Courtney Livingston was diagnosed with invasive lobular carcinoma, ER-/PR+/Her2-, of the right breast. The treatment plan included right mastectomy, chemotherapy, and antiestrogen therapy with tamoxifen.  Courtney Livingston also reports a history of skin cancers removed from her face when she was in her late 25s and 15s.   In 2008, Courtney Livingston had negative genetic testing of the BRCA1 and BRCA2 genes through [facility].    RISK FACTORS:  Menarche was at age 70.  First live birth at age 43.  OCP use for approximately 5-6 years.  Ovaries intact: yes.  Hysterectomy: no.  Menopausal status: postmenopausal.  HRT use: 0 years. Colonoscopy: yes; around 10 cumulative polyps, per patient. Mammogram within the last year: yes. Any excessive radiation exposure in  the past: no.   Past Medical History:  Diagnosis Date   Asthma    Breast cancer ([place])    Diabetes mellitus without complication ([place])    Family history of adrenal cancer    Family history of breast cancer    Family history of colon cancer    Family history of lung cancer    Family history of pancreatic cancer    HTN (hypertension), benign 05/22/2012   Hypercholesterolemia    Hypertension    Osteoporosis 05/22/2012    Past Surgical History:  Procedure Laterality Date   MASTECTOMY     TONSILLECTOMY      Social History   Socioeconomic History   Marital status: Married    Spouse name: Not on file   Number of children: Not on file   Years of education: Not on file   Highest education level: Not on file  Occupational History   Not on file  Tobacco Use   Smoking status: Never Smoker  Substance and Sexual Activity   Alcohol use: No   Drug use: Not on file   Sexual activity: Never  Other Topics Concern   Not on file  Social History Narrative   Not on file   Social Determinants of Health   Financial Resource Strain:    Difficulty of Paying Living Expenses: Not on file  Food Insecurity:    Worried About [place] in the Last Year: Not on file   Ran Out of Food in the Last Year: Not on file  Transportation Needs:    Lack of Transportation (  Medical): Not on file   Lack of Transportation (Non-Medical): Not on file  Physical Activity:    Days of Exercise per Week: Not on file   Minutes of Exercise per Session: Not on file  Stress:    Feeling of Stress : Not on file  Social Connections:    Frequency of Communication with Friends and Family: Not on file   Frequency of Social Gatherings with Friends and Family: Not on file   Attends Religious Services: Not on file   Active Member of Clubs or Organizations: Not on file   Attends Archivist Meetings: Not on file   Marital Status: Not on file     FAMILY HISTORY:   We obtained a detailed, 4-generation family history.  Significant diagnoses are listed below: Family History  Problem Relation Age of Onset   Cancer Other    Hypertension Other    Diabetes Other    Congestive Heart Failure Mother    Pancreatic cancer Father 53   Lung cancer Maternal Uncle        smoker   Diabetes Maternal Grandmother    Liver cancer Paternal Grandmother        spread to pancreas, died in her 68s   Alzheimer's disease Maternal Uncle    Colon cancer Maternal Uncle 48   Breast cancer Cousin 25       maternal first cousin   Cancer Cousin 76       adrenal cancer, maternal first cousin   Courtney Livingston has two daughters (ages 80 and 76). Neither of her daughters have had cancer. She does not have any siblings.  Courtney Livingston mother died at the age of 68 from congestive heart failure and did not have cancer. Courtney Livingston had three maternal uncles. One uncle died at the age of 25 from lung cancer and was a smoker. Another uncle died from colon cancer at the age of 64. Courtney Livingston knows of two maternal first cousins who had cancer - one cousin had breast cancer when she was 18, and the other cousin had adrenal cancer when he was 83. Mr. Strausser maternal grandmother died in her 50s from diabetes, and her maternal grandfather died at the age of 26 from a digestive tract infection.  Courtney Livingston father died at the age of 26 from pancreatic cancer, diagnosed at the age of 34. She had one paternal uncle who died when he was 4 from the 10/08/1916 flu epidemic. Her paternal grandmother died in her 57s from liver cancer that had spread to her pancreas. Her paternal grandfather died in his 70s from the 10-08-16 flu epidemic.  Courtney Livingston is unaware of previous family history of genetic testing for hereditary cancer risks. Patient's maternal ancestors are of [place] descent.  GENETIC COUNSELING ASSESSMENT: Ms.  Livingston is a 74 y.o. female with a personal history of young-onset breast cancer and a family history of pancreatic cancer, breast cancer, and adrenal cancer, which is somewhat suggestive of a hereditary cancer syndrome and predisposition to cancer. We, therefore, discussed and recommended the following at today's visit.   DISCUSSION: We discussed that approximately 5-10% of breast cancer is hereditary, with most cases associated with the BRCA1 and BRCA2 genes. There are other genes that can be associated with hereditary breast cancer syndromes. These include ATM, CHEK2, PALB2, etc. We discussed that testing is beneficial for several reasons, including knowing about other cancer risks, identifying potential screening and risk-reduction options that may be appropriate, and to understand if  other family members could be at risk for cancer and allow them to undergo genetic testing.   Courtney Livingston had normal genetic testing for the BRCA1 and BRCA2 genes in 2008. Because there are other genes known to increase breast cancer risk that she has not had testing for, and because mutations in these genes may impact medical management, it is appropriate to pursue updated genetic testing for other hereditary breast cancer genes.   We reviewed the characteristics, features and inheritance patterns of hereditary cancer syndromes. We also discussed genetic testing, including the appropriate family members to test, the process of testing, insurance coverage and turn-around-time for results. We discussed the implications of a negative, positive and/or variant of uncertain significant result. We recommended Courtney Livingston pursue genetic testing for the [facility] Common Hereditary Cancers panel.   The Common Hereditary Cancers Panel offered by [facility] includes sequencing and/or deletion duplication testing of the following 48 genes: APC, ATM, AXIN2, BARD1, BMPR1A, BRCA1, BRCA2, BRIP1, CDH1, CDK4, CDKN2A  (p14ARF), CDKN2A (p16INK4a), CHEK2, CTNNA1, DICER1, EPCAM (Deletion/duplication testing only), GREM1 (promoter region deletion/duplication testing only), KIT, MEN1, MLH1, MSH2, MSH3, MSH6, MUTYH, NBN, NF1, NTHL1, PALB2, PDGFRA, PMS2, POLD1, POLE, PTEN, RAD50, RAD51C, RAD51D, RNF43, SDHB, SDHC, SDHD, SMAD4, SMARCA4. STK11, TP53, TSC1, TSC2, and VHL.  The following genes were evaluated for sequence changes only: SDHA and HOXB13 c.251G>A variant only.  Based on Courtney Livingston's personal and family history of cancer, she meets medical criteria for genetic testing. Despite that she meets criteria, she may still have an out of pocket cost. We discussed that if her out of pocket cost for testing is over $100, the laboratory will reach out to let her know. If the out of pocket cost of testing is less than $100 she will be billed by the genetic testing laboratory.   PLAN: After considering the risks, benefits, and limitations, Courtney Livingston provided informed consent to pursue genetic testing. The blood sample will be drawn on 03/24/20 and sent to [facility] for analysis of the Common Hereditary Cancers Panel. Results should be available within approximately two-three weeks' time, at which point they will be disclosed by telephone to Courtney Livingston, as will any additional recommendations warranted by these results. Courtney Livingston will receive a summary of her genetic counseling visit and a copy of her results once available. This information will also be available in Epic.   Courtney Livingston questions were answered to her satisfaction today. Our contact information was provided should additional questions or concerns arise. Thank you for the referral and allowing [place] to share in the care of your patient.   Clint Guy, [place], [facility] Licensed, Certified Dispensing [email or web address] Phone: [phone]  The patient was seen for a total of 50 minutes in  face-to-face genetic counseling.  This patient was discussed with Drs. Magrinat, Lindi Adie and/or Burr Medico who agrees with the above.    _______________________________________________________________________ For Office Staff:  Number of people involved in session: 2 Was an Intern/ student involved with case: yes

## 2020-03-22 NOTE — Telephone Encounter (Signed)
Scheduled Ms. Chambers-Merriman to have her blood drawn for genetic testing on Friday, 9/3 at 10:45 am.

## 2020-03-24 ENCOUNTER — Inpatient Hospital Stay: Payer: Medicare PPO | Attending: Ophthalmology

## 2020-03-24 ENCOUNTER — Other Ambulatory Visit: Payer: Self-pay

## 2020-04-06 ENCOUNTER — Ambulatory Visit: Payer: Self-pay | Admitting: Genetic Counselor

## 2020-04-06 ENCOUNTER — Encounter: Payer: Self-pay | Admitting: Genetic Counselor

## 2020-04-06 ENCOUNTER — Telehealth: Payer: Self-pay | Admitting: Genetic Counselor

## 2020-04-06 DIAGNOSIS — Z1379 Encounter for other screening for genetic and chromosomal anomalies: Secondary | ICD-10-CM

## 2020-04-06 NOTE — Telephone Encounter (Signed)
Revealed negative genetic testing. Discussed that we do not know why she has had breast cancer or why there is cancer in the family. There could be a genetic mutation in the family that Courtney Livingston did not inherit. There could also be a mutation in a different gene that we are not testing, or our current technology may not be able detect certain mutations. It will therefore be important for her to stay in contact with genetics to keep up with whether additional testing may be appropriate in the future.

## 2020-04-06 NOTE — Progress Notes (Signed)
HPI:  Courtney Livingston was previously seen in the [facility] due to a personal and family history of cancer and concerns regarding a hereditary predisposition to cancer. Please refer to our prior cancer genetics clinic note for more information regarding our discussion, assessment and recommendations, at the time. Courtney Livingston recent genetic test results were disclosed to her, as were recommendations warranted by these results. These results and recommendations are discussed in more detail below.  FAMILY HISTORY:  We obtained a detailed, 4-generation family history.  Significant diagnoses are listed below: Family History  Problem Relation Age of Onset  . Cancer Other   . Hypertension Other   . Diabetes Other   . Congestive Heart Failure Mother   . Pancreatic cancer Father 2  . Lung cancer Maternal Uncle        smoker  . Diabetes Maternal Grandmother   . Liver cancer Paternal Grandmother        spread to pancreas, died in her 85s  . Alzheimer's disease Maternal Uncle   . Colon cancer Maternal Uncle 79  . Breast cancer Cousin 5       maternal first cousin  . Cancer Cousin 61       adrenal cancer, maternal first cousin   Courtney Livingston has two daughters (ages 42 and 82). Neither of her daughters have had cancer. She does not have any siblings.  Courtney Livingston mother died at the age of 49 from congestive heart failure and did not have cancer. Courtney Livingston had three maternal uncles. One uncle died at the age of 64 from lung cancer and was a smoker. Another uncle died from colon cancer at the age of 57. Courtney Livingston knows of two maternal first cousins who had cancer - one cousin had breast cancer when she was 46, and the other cousin had adrenal cancer when he was 74. Mr. Livingston maternal grandmother died in her 5s from diabetes, and her maternal grandfather died at the age of 34 from a digestive tract  infection.  Courtney Livingston father died at the age of 63 from pancreatic cancer, diagnosed at the age of 79. She had one paternal uncle who died when he was 4 from the 1916/10/20 flu epidemic. Her paternal grandmother died in her 42s from liver cancer that had spread to her pancreas. Her paternal grandfather died in his 10s from the Oct 20, 1916 flu epidemic.  Courtney Livingston is unaware of previous family history of genetic testing for hereditary cancer risks. Patient's maternal ancestors are of [place] descent.  GENETIC TEST RESULTS: Genetic testing reported out on 04/01/2020 through the [facility] Common Hereditary Cancers panel. No pathogenic variants were detected.   The Common Hereditary Cancers Panel offered by [facility] includes sequencing and/or deletion duplication testing of the following 48 genes: APC, ATM, AXIN2, BARD1, BMPR1A, BRCA1, BRCA2, BRIP1, CDH1, CDK4, CDKN2A (p14ARF), CDKN2A (p16INK4a), CHEK2, CTNNA1, DICER1, EPCAM (Deletion/duplication testing only), GREM1 (promoter region deletion/duplication testing only), KIT, MEN1, MLH1, MSH2, MSH3, MSH6, MUTYH, NBN, NF1, NTHL1, PALB2, PDGFRA, PMS2, POLD1, POLE, PTEN, RAD50, RAD51C, RAD51D, RNF43, SDHB, SDHC, SDHD, SMAD4, SMARCA4. STK11, TP53, TSC1, TSC2, and VHL.  The following genes were evaluated for sequence changes only: SDHA and HOXB13 c.251G>A variant only. The test report will be scanned into EPIC and located under the Molecular Pathology section of the Results Review tab.  A portion of the result report is included below for reference.     We discussed with Courtney Livingston that because current genetic testing is  not perfect, it is possible there may be a gene mutation in one of these genes that current testing cannot detect, but that chance is small.  We also discussed that there could be another gene that has not yet been discovered, or that we have not yet tested, that is responsible for the cancer diagnoses in the family. It is  also possible there is a hereditary cause for the cancer in the family that Courtney Livingston did not inherit and therefore was not identified in her testing.  Therefore, it is important to remain in touch with cancer genetics in the future so that we can continue to offer Courtney Livingston the most up to date genetic testing.   CANCER SCREENING RECOMMENDATIONS: Courtney Livingston test result is considered negative (normal).  This means that we have not identified a hereditary cause for her personal and family history of cancer at this time. While reassuring, this does not definitively rule out a hereditary predisposition to cancer. It is still possible that there could be genetic mutations that are undetectable by current technology. There could be genetic mutations in genes that have not been tested or identified to increase cancer risk.  Therefore, it is recommended she continue to follow the cancer management and screening guidelines provided by her primary healthcare provider.   An individual's cancer risk and medical management are not determined by genetic test results alone. Overall cancer risk assessment incorporates additional factors, including personal medical history, family history, and any available genetic information that may result in a personalized plan for cancer prevention and surveillance.  RECOMMENDATIONS FOR FAMILY MEMBERS:  Individuals in this family might be at some increased risk of developing cancer, over the general population risk, simply due to the family history of cancer.  We recommended women in this family have a yearly mammogram beginning at age 6, or 4 years younger than the earliest onset of cancer, an annual clinical breast exam, and perform monthly breast self-exams. Women in this family should also have a gynecological exam as recommended by their primary provider. All family members should be referred for colonoscopy starting at age 69.  FOLLOW-UP:  Lastly, we discussed with Courtney Livingston that cancer genetics is a rapidly advancing field and it is possible that new genetic tests will be appropriate for her and/or her family members in the future. We encouraged her to remain in contact with cancer genetics on an annual basis so we can update her personal and family histories and let her know of advances in cancer genetics that may benefit this family.   Our contact number was provided. Courtney Livingston questions were answered to her satisfaction, and she knows she is welcome to call us at anytime with additional questions or concerns.   Clint Guy, MS, [facility] Genetic Counselor [email or web address] Phone: [phone]

## 2020-07-13 DIAGNOSIS — K580 Irritable bowel syndrome with diarrhea: Secondary | ICD-10-CM | POA: Diagnosis not present

## 2020-07-13 DIAGNOSIS — N3281 Overactive bladder: Secondary | ICD-10-CM | POA: Diagnosis not present

## 2020-07-13 DIAGNOSIS — K219 Gastro-esophageal reflux disease without esophagitis: Secondary | ICD-10-CM | POA: Diagnosis not present

## 2020-07-13 DIAGNOSIS — E785 Hyperlipidemia, unspecified: Secondary | ICD-10-CM | POA: Diagnosis not present

## 2020-07-13 DIAGNOSIS — I10 Essential (primary) hypertension: Secondary | ICD-10-CM | POA: Diagnosis not present

## 2020-07-13 DIAGNOSIS — E1169 Type 2 diabetes mellitus with other specified complication: Secondary | ICD-10-CM | POA: Diagnosis not present

## 2020-09-28 DIAGNOSIS — E1169 Type 2 diabetes mellitus with other specified complication: Secondary | ICD-10-CM | POA: Diagnosis not present

## 2020-09-28 DIAGNOSIS — J301 Allergic rhinitis due to pollen: Secondary | ICD-10-CM | POA: Diagnosis not present

## 2020-09-28 DIAGNOSIS — Z0000 Encounter for general adult medical examination without abnormal findings: Secondary | ICD-10-CM | POA: Diagnosis not present

## 2020-09-28 DIAGNOSIS — E2839 Other primary ovarian failure: Secondary | ICD-10-CM | POA: Diagnosis not present

## 2020-09-28 DIAGNOSIS — E785 Hyperlipidemia, unspecified: Secondary | ICD-10-CM | POA: Diagnosis not present

## 2020-09-28 DIAGNOSIS — I10 Essential (primary) hypertension: Secondary | ICD-10-CM | POA: Diagnosis not present

## 2020-09-28 DIAGNOSIS — E559 Vitamin D deficiency, unspecified: Secondary | ICD-10-CM | POA: Diagnosis not present

## 2020-09-28 DIAGNOSIS — Z1159 Encounter for screening for other viral diseases: Secondary | ICD-10-CM | POA: Diagnosis not present

## 2020-09-28 DIAGNOSIS — N3281 Overactive bladder: Secondary | ICD-10-CM | POA: Diagnosis not present

## 2020-09-29 ENCOUNTER — Other Ambulatory Visit: Payer: Self-pay | Admitting: Family Medicine

## 2020-09-29 DIAGNOSIS — R140 Abdominal distension (gaseous): Secondary | ICD-10-CM

## 2020-10-10 ENCOUNTER — Ambulatory Visit
Admission: RE | Admit: 2020-10-10 | Discharge: 2020-10-10 | Disposition: A | Discharge status: Home / Self Care | Payer: Medicare PPO | Source: Ambulatory Visit | Attending: Family Medicine | Admitting: Family Medicine

## 2020-10-10 DIAGNOSIS — R140 Abdominal distension (gaseous): Secondary | ICD-10-CM

## 2020-10-26 DIAGNOSIS — M8589 Other specified disorders of bone density and structure, multiple sites: Secondary | ICD-10-CM | POA: Diagnosis not present

## 2020-10-26 DIAGNOSIS — Z780 Asymptomatic menopausal state: Secondary | ICD-10-CM | POA: Diagnosis not present

## 2021-02-22 DIAGNOSIS — Z1231 Encounter for screening mammogram for malignant neoplasm of breast: Secondary | ICD-10-CM | POA: Diagnosis not present

## 2021-03-20 DIAGNOSIS — D3131 Benign neoplasm of right choroid: Secondary | ICD-10-CM | POA: Diagnosis not present

## 2021-03-20 DIAGNOSIS — H524 Presbyopia: Secondary | ICD-10-CM | POA: Diagnosis not present

## 2021-03-20 DIAGNOSIS — E119 Type 2 diabetes mellitus without complications: Secondary | ICD-10-CM | POA: Diagnosis not present

## 2021-03-20 DIAGNOSIS — Z961 Presence of intraocular lens: Secondary | ICD-10-CM | POA: Diagnosis not present

## 2021-04-05 DIAGNOSIS — M79672 Pain in left foot: Secondary | ICD-10-CM | POA: Diagnosis not present

## 2021-04-05 DIAGNOSIS — R102 Pelvic and perineal pain: Secondary | ICD-10-CM | POA: Diagnosis not present

## 2021-04-05 DIAGNOSIS — M79671 Pain in right foot: Secondary | ICD-10-CM | POA: Diagnosis not present

## 2021-04-05 DIAGNOSIS — N3281 Overactive bladder: Secondary | ICD-10-CM | POA: Diagnosis not present

## 2021-04-05 DIAGNOSIS — Z1159 Encounter for screening for other viral diseases: Secondary | ICD-10-CM | POA: Diagnosis not present

## 2021-04-05 DIAGNOSIS — I10 Essential (primary) hypertension: Secondary | ICD-10-CM | POA: Diagnosis not present

## 2021-04-05 DIAGNOSIS — E1169 Type 2 diabetes mellitus with other specified complication: Secondary | ICD-10-CM | POA: Diagnosis not present

## 2021-04-05 DIAGNOSIS — E785 Hyperlipidemia, unspecified: Secondary | ICD-10-CM | POA: Diagnosis not present

## 2021-04-16 DIAGNOSIS — M79672 Pain in left foot: Secondary | ICD-10-CM | POA: Diagnosis not present

## 2021-04-16 DIAGNOSIS — M79671 Pain in right foot: Secondary | ICD-10-CM | POA: Diagnosis not present

## 2021-07-19 DIAGNOSIS — Z809 Family history of malignant neoplasm, unspecified: Secondary | ICD-10-CM | POA: Diagnosis not present

## 2021-07-19 DIAGNOSIS — E669 Obesity, unspecified: Secondary | ICD-10-CM | POA: Diagnosis not present

## 2021-07-19 DIAGNOSIS — K5900 Constipation, unspecified: Secondary | ICD-10-CM | POA: Diagnosis not present

## 2021-07-19 DIAGNOSIS — Z8249 Family history of ischemic heart disease and other diseases of the circulatory system: Secondary | ICD-10-CM | POA: Diagnosis not present

## 2021-07-19 DIAGNOSIS — I10 Essential (primary) hypertension: Secondary | ICD-10-CM | POA: Diagnosis not present

## 2021-07-19 DIAGNOSIS — E119 Type 2 diabetes mellitus without complications: Secondary | ICD-10-CM | POA: Diagnosis not present

## 2021-07-19 DIAGNOSIS — N3281 Overactive bladder: Secondary | ICD-10-CM | POA: Diagnosis not present

## 2021-07-19 DIAGNOSIS — E785 Hyperlipidemia, unspecified: Secondary | ICD-10-CM | POA: Diagnosis not present

## 2021-07-19 DIAGNOSIS — Z6830 Body mass index (BMI) 30.0-30.9, adult: Secondary | ICD-10-CM | POA: Diagnosis not present

## 2021-10-11 DIAGNOSIS — E1169 Type 2 diabetes mellitus with other specified complication: Secondary | ICD-10-CM | POA: Diagnosis not present

## 2021-10-11 DIAGNOSIS — E559 Vitamin D deficiency, unspecified: Secondary | ICD-10-CM | POA: Diagnosis not present

## 2021-10-11 DIAGNOSIS — E785 Hyperlipidemia, unspecified: Secondary | ICD-10-CM | POA: Diagnosis not present

## 2021-10-15 DIAGNOSIS — J301 Allergic rhinitis due to pollen: Secondary | ICD-10-CM | POA: Diagnosis not present

## 2021-10-15 DIAGNOSIS — E785 Hyperlipidemia, unspecified: Secondary | ICD-10-CM | POA: Diagnosis not present

## 2021-10-15 DIAGNOSIS — I10 Essential (primary) hypertension: Secondary | ICD-10-CM | POA: Diagnosis not present

## 2021-10-15 DIAGNOSIS — Z0000 Encounter for general adult medical examination without abnormal findings: Secondary | ICD-10-CM | POA: Diagnosis not present

## 2021-10-15 DIAGNOSIS — K219 Gastro-esophageal reflux disease without esophagitis: Secondary | ICD-10-CM | POA: Diagnosis not present

## 2021-10-15 DIAGNOSIS — E559 Vitamin D deficiency, unspecified: Secondary | ICD-10-CM | POA: Diagnosis not present

## 2021-10-15 DIAGNOSIS — E1169 Type 2 diabetes mellitus with other specified complication: Secondary | ICD-10-CM | POA: Diagnosis not present

## 2021-10-15 DIAGNOSIS — M8580 Other specified disorders of bone density and structure, unspecified site: Secondary | ICD-10-CM | POA: Diagnosis not present

## 2021-10-15 DIAGNOSIS — N3281 Overactive bladder: Secondary | ICD-10-CM | POA: Diagnosis not present

## 2022-02-19 DIAGNOSIS — E785 Hyperlipidemia, unspecified: Secondary | ICD-10-CM | POA: Diagnosis not present

## 2022-02-19 DIAGNOSIS — Z809 Family history of malignant neoplasm, unspecified: Secondary | ICD-10-CM | POA: Diagnosis not present

## 2022-02-19 DIAGNOSIS — K219 Gastro-esophageal reflux disease without esophagitis: Secondary | ICD-10-CM | POA: Diagnosis not present

## 2022-02-19 DIAGNOSIS — J301 Allergic rhinitis due to pollen: Secondary | ICD-10-CM | POA: Diagnosis not present

## 2022-02-19 DIAGNOSIS — I10 Essential (primary) hypertension: Secondary | ICD-10-CM | POA: Diagnosis not present

## 2022-02-19 DIAGNOSIS — E119 Type 2 diabetes mellitus without complications: Secondary | ICD-10-CM | POA: Diagnosis not present

## 2022-02-19 DIAGNOSIS — K5900 Constipation, unspecified: Secondary | ICD-10-CM | POA: Diagnosis not present

## 2022-02-19 DIAGNOSIS — N3281 Overactive bladder: Secondary | ICD-10-CM | POA: Diagnosis not present

## 2022-02-19 DIAGNOSIS — M8580 Other specified disorders of bone density and structure, unspecified site: Secondary | ICD-10-CM | POA: Diagnosis not present

## 2022-02-22 DIAGNOSIS — R300 Dysuria: Secondary | ICD-10-CM | POA: Diagnosis not present

## 2022-02-28 DIAGNOSIS — Z1231 Encounter for screening mammogram for malignant neoplasm of breast: Secondary | ICD-10-CM | POA: Diagnosis not present

## 2022-03-26 DIAGNOSIS — H26491 Other secondary cataract, right eye: Secondary | ICD-10-CM | POA: Diagnosis not present

## 2022-03-26 DIAGNOSIS — E119 Type 2 diabetes mellitus without complications: Secondary | ICD-10-CM | POA: Diagnosis not present

## 2022-03-26 DIAGNOSIS — H524 Presbyopia: Secondary | ICD-10-CM | POA: Diagnosis not present

## 2022-03-27 DIAGNOSIS — K580 Irritable bowel syndrome with diarrhea: Secondary | ICD-10-CM | POA: Diagnosis not present

## 2022-03-27 DIAGNOSIS — E785 Hyperlipidemia, unspecified: Secondary | ICD-10-CM | POA: Diagnosis not present

## 2022-03-27 DIAGNOSIS — Z79899 Other long term (current) drug therapy: Secondary | ICD-10-CM | POA: Diagnosis not present

## 2022-03-27 DIAGNOSIS — N3281 Overactive bladder: Secondary | ICD-10-CM | POA: Diagnosis not present

## 2022-03-27 DIAGNOSIS — R1319 Other dysphagia: Secondary | ICD-10-CM | POA: Diagnosis not present

## 2022-03-27 DIAGNOSIS — I10 Essential (primary) hypertension: Secondary | ICD-10-CM | POA: Diagnosis not present

## 2022-03-27 DIAGNOSIS — M79672 Pain in left foot: Secondary | ICD-10-CM | POA: Diagnosis not present

## 2022-03-27 DIAGNOSIS — E559 Vitamin D deficiency, unspecified: Secondary | ICD-10-CM | POA: Diagnosis not present

## 2022-03-27 DIAGNOSIS — K219 Gastro-esophageal reflux disease without esophagitis: Secondary | ICD-10-CM | POA: Diagnosis not present

## 2022-03-27 DIAGNOSIS — E1169 Type 2 diabetes mellitus with other specified complication: Secondary | ICD-10-CM | POA: Diagnosis not present

## 2022-04-12 DIAGNOSIS — D122 Benign neoplasm of ascending colon: Secondary | ICD-10-CM | POA: Diagnosis not present

## 2022-04-12 DIAGNOSIS — Z09 Encounter for follow-up examination after completed treatment for conditions other than malignant neoplasm: Secondary | ICD-10-CM | POA: Diagnosis not present

## 2022-04-12 DIAGNOSIS — K2970 Gastritis, unspecified, without bleeding: Secondary | ICD-10-CM | POA: Diagnosis not present

## 2022-04-12 DIAGNOSIS — K648 Other hemorrhoids: Secondary | ICD-10-CM | POA: Diagnosis not present

## 2022-04-12 DIAGNOSIS — R1310 Dysphagia, unspecified: Secondary | ICD-10-CM | POA: Diagnosis not present

## 2022-04-12 DIAGNOSIS — K449 Diaphragmatic hernia without obstruction or gangrene: Secondary | ICD-10-CM | POA: Diagnosis not present

## 2022-04-12 DIAGNOSIS — Z86010 Personal history of colonic polyps: Secondary | ICD-10-CM | POA: Diagnosis not present

## 2022-04-12 DIAGNOSIS — K2930 Chronic superficial gastritis without bleeding: Secondary | ICD-10-CM | POA: Diagnosis not present

## 2022-04-12 DIAGNOSIS — K5730 Diverticulosis of large intestine without perforation or abscess without bleeding: Secondary | ICD-10-CM | POA: Diagnosis not present

## 2022-04-15 DIAGNOSIS — R102 Pelvic and perineal pain: Secondary | ICD-10-CM | POA: Diagnosis not present

## 2022-04-15 DIAGNOSIS — N952 Postmenopausal atrophic vaginitis: Secondary | ICD-10-CM | POA: Diagnosis not present

## 2022-04-15 DIAGNOSIS — R351 Nocturia: Secondary | ICD-10-CM | POA: Diagnosis not present

## 2022-04-16 DIAGNOSIS — K2930 Chronic superficial gastritis without bleeding: Secondary | ICD-10-CM | POA: Diagnosis not present

## 2022-04-16 DIAGNOSIS — D122 Benign neoplasm of ascending colon: Secondary | ICD-10-CM | POA: Diagnosis not present

## 2022-04-24 DIAGNOSIS — H26491 Other secondary cataract, right eye: Secondary | ICD-10-CM | POA: Diagnosis not present

## 2022-05-20 DIAGNOSIS — R102 Pelvic and perineal pain: Secondary | ICD-10-CM | POA: Diagnosis not present

## 2022-05-20 DIAGNOSIS — M6281 Muscle weakness (generalized): Secondary | ICD-10-CM | POA: Diagnosis not present

## 2022-05-20 DIAGNOSIS — R351 Nocturia: Secondary | ICD-10-CM | POA: Diagnosis not present

## 2022-05-20 DIAGNOSIS — M6289 Other specified disorders of muscle: Secondary | ICD-10-CM | POA: Diagnosis not present

## 2022-05-20 DIAGNOSIS — M62838 Other muscle spasm: Secondary | ICD-10-CM | POA: Diagnosis not present

## 2022-05-28 DIAGNOSIS — R102 Pelvic and perineal pain: Secondary | ICD-10-CM | POA: Diagnosis not present

## 2022-05-28 DIAGNOSIS — M62838 Other muscle spasm: Secondary | ICD-10-CM | POA: Diagnosis not present

## 2022-05-28 DIAGNOSIS — M6281 Muscle weakness (generalized): Secondary | ICD-10-CM | POA: Diagnosis not present

## 2022-05-28 DIAGNOSIS — R351 Nocturia: Secondary | ICD-10-CM | POA: Diagnosis not present

## 2022-05-28 DIAGNOSIS — M6289 Other specified disorders of muscle: Secondary | ICD-10-CM | POA: Diagnosis not present

## 2022-06-27 DIAGNOSIS — M62838 Other muscle spasm: Secondary | ICD-10-CM | POA: Diagnosis not present

## 2022-06-27 DIAGNOSIS — R102 Pelvic and perineal pain: Secondary | ICD-10-CM | POA: Diagnosis not present

## 2022-06-27 DIAGNOSIS — R351 Nocturia: Secondary | ICD-10-CM | POA: Diagnosis not present

## 2022-06-27 DIAGNOSIS — M6289 Other specified disorders of muscle: Secondary | ICD-10-CM | POA: Diagnosis not present

## 2022-06-27 DIAGNOSIS — M6281 Muscle weakness (generalized): Secondary | ICD-10-CM | POA: Diagnosis not present

## 2022-11-18 DIAGNOSIS — K580 Irritable bowel syndrome with diarrhea: Secondary | ICD-10-CM | POA: Diagnosis not present

## 2022-11-18 DIAGNOSIS — D72829 Elevated white blood cell count, unspecified: Secondary | ICD-10-CM | POA: Diagnosis not present

## 2022-11-18 DIAGNOSIS — N3281 Overactive bladder: Secondary | ICD-10-CM | POA: Diagnosis not present

## 2022-11-18 DIAGNOSIS — E559 Vitamin D deficiency, unspecified: Secondary | ICD-10-CM | POA: Diagnosis not present

## 2022-11-18 DIAGNOSIS — E1169 Type 2 diabetes mellitus with other specified complication: Secondary | ICD-10-CM | POA: Diagnosis not present

## 2022-11-18 DIAGNOSIS — E785 Hyperlipidemia, unspecified: Secondary | ICD-10-CM | POA: Diagnosis not present

## 2022-11-18 DIAGNOSIS — G5762 Lesion of plantar nerve, left lower limb: Secondary | ICD-10-CM | POA: Diagnosis not present

## 2022-11-18 DIAGNOSIS — M8580 Other specified disorders of bone density and structure, unspecified site: Secondary | ICD-10-CM | POA: Diagnosis not present

## 2022-11-18 DIAGNOSIS — Z0000 Encounter for general adult medical examination without abnormal findings: Secondary | ICD-10-CM | POA: Diagnosis not present

## 2022-11-18 DIAGNOSIS — E119 Type 2 diabetes mellitus without complications: Secondary | ICD-10-CM | POA: Diagnosis not present

## 2022-11-18 DIAGNOSIS — I10 Essential (primary) hypertension: Secondary | ICD-10-CM | POA: Diagnosis not present

## 2022-11-18 DIAGNOSIS — H6121 Impacted cerumen, right ear: Secondary | ICD-10-CM | POA: Diagnosis not present

## 2023-03-06 DIAGNOSIS — M8588 Other specified disorders of bone density and structure, other site: Secondary | ICD-10-CM | POA: Diagnosis not present

## 2023-03-06 DIAGNOSIS — Z853 Personal history of malignant neoplasm of breast: Secondary | ICD-10-CM | POA: Diagnosis not present

## 2023-03-06 DIAGNOSIS — Z1231 Encounter for screening mammogram for malignant neoplasm of breast: Secondary | ICD-10-CM | POA: Diagnosis not present

## 2023-03-06 DIAGNOSIS — K5190 Ulcerative colitis, unspecified, without complications: Secondary | ICD-10-CM | POA: Diagnosis not present

## 2023-03-13 DIAGNOSIS — R922 Inconclusive mammogram: Secondary | ICD-10-CM | POA: Diagnosis not present

## 2023-03-13 DIAGNOSIS — N6489 Other specified disorders of breast: Secondary | ICD-10-CM | POA: Diagnosis not present

## 2023-03-20 IMAGING — US US PELVIS COMPLETE
1 series · 14 of 25 positions shown · non-contrast
Comparison: 12/09/2000, report only.

CLINICAL DATA: Abdominal bloating.  History of breast cancer.

EXAM:
TRANSABDOMINAL ULTRASOUND OF PELVIS
TECHNIQUE: Transabdominal ultrasound examination of the pelvis was performed
including evaluation of the uterus, ovaries, adnexal regions, and
pelvic cul-de-sac.

[Series 1: us pelvis complete · 0.15mm/px · 14 of 30 slices shown]
[im 1/30]
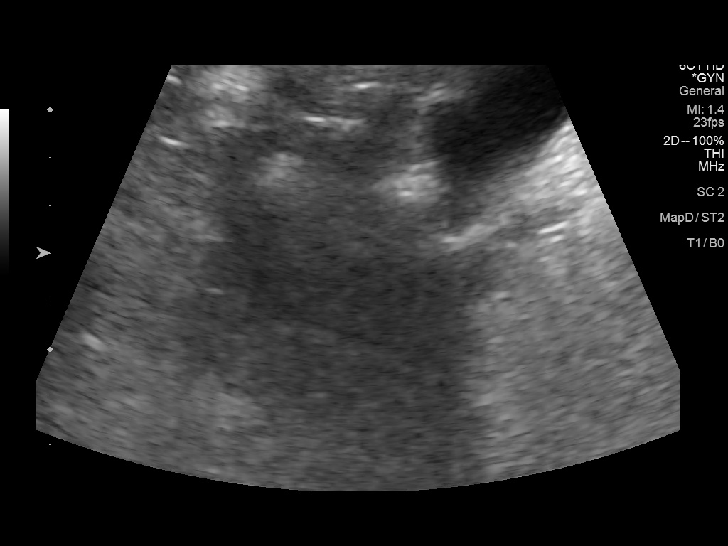
[im 3/30]
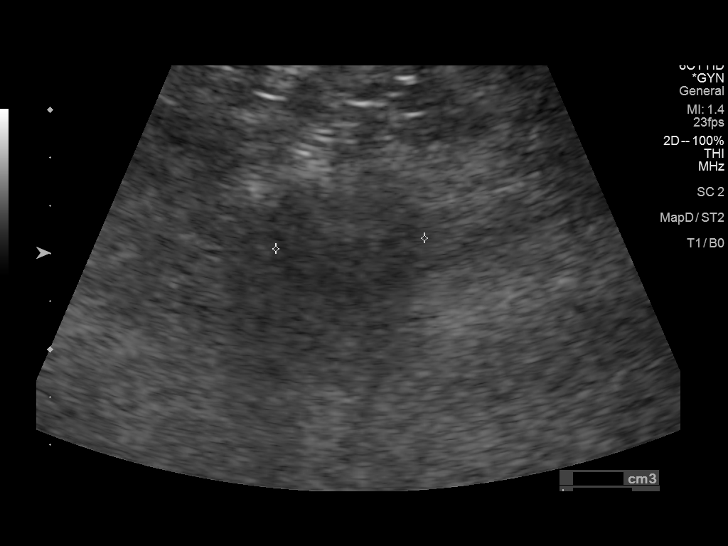
[im 5/30]
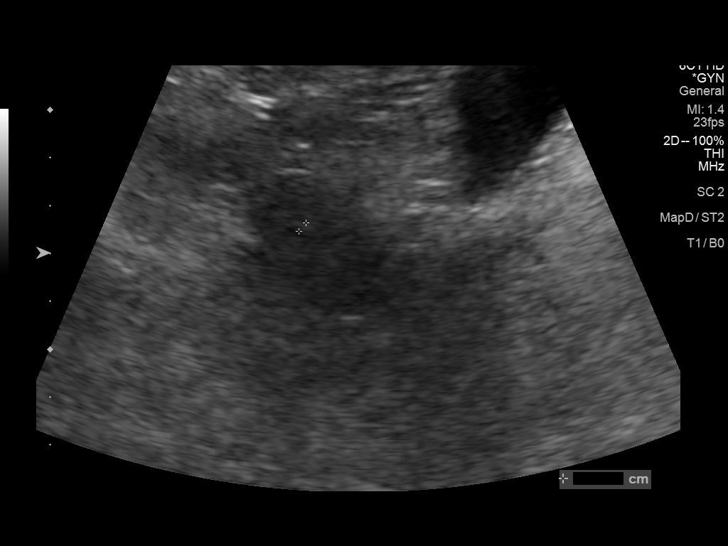
[im 8/30]
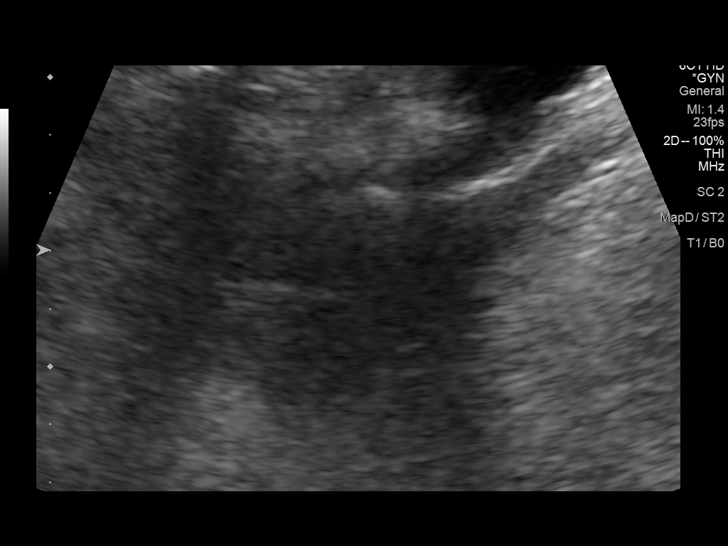
[im 10/30]
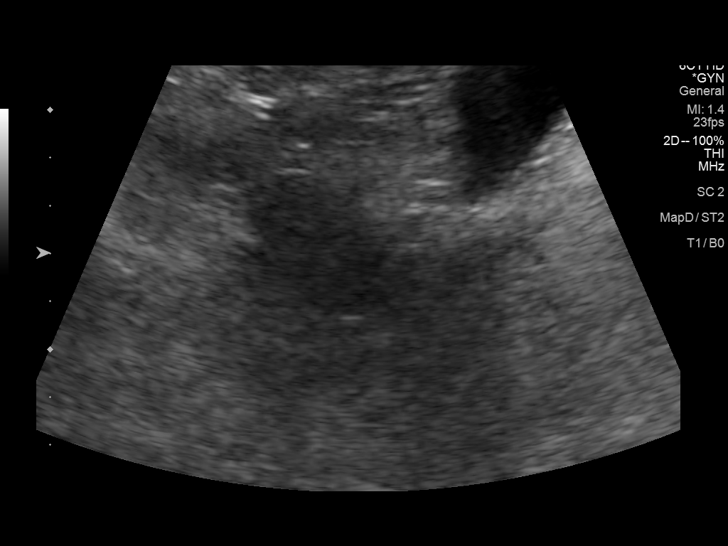
[im 11/30]
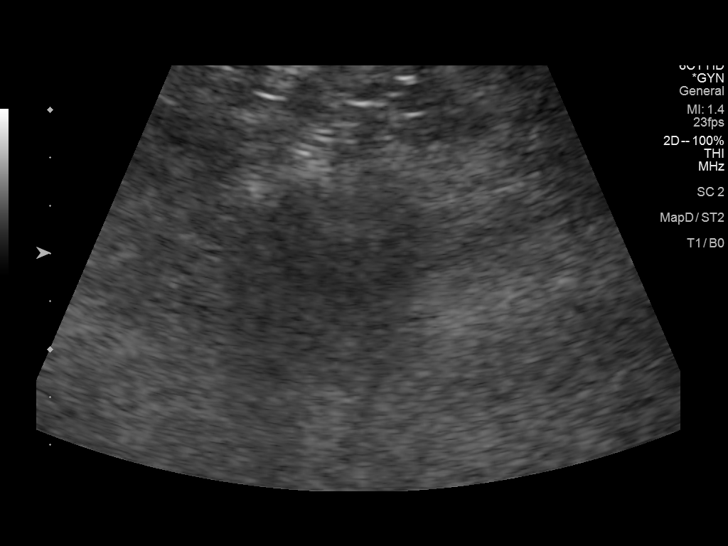
[im 14/30]
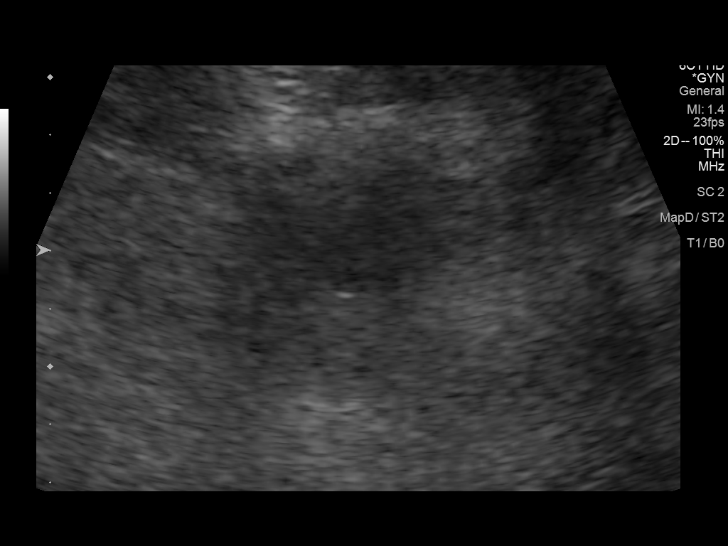
[im 16/30]
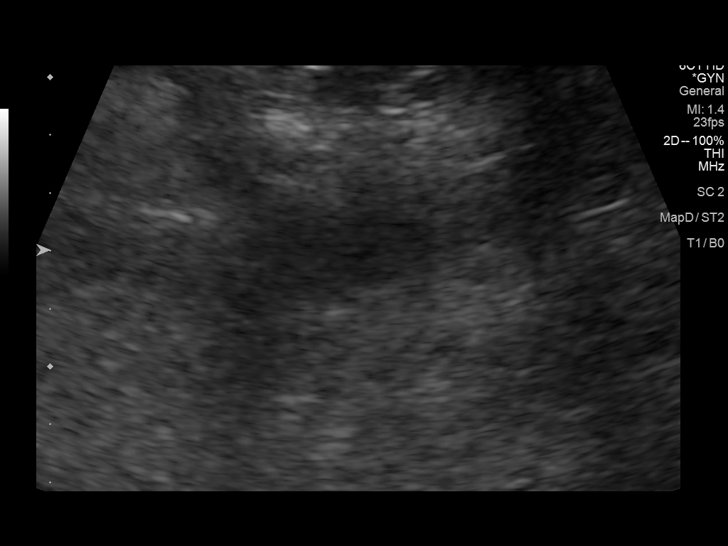
[im 19/30]
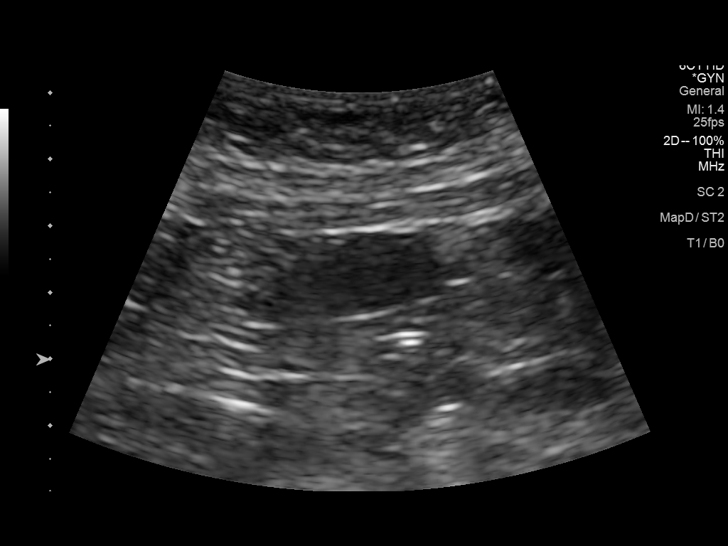
[im 20/30]
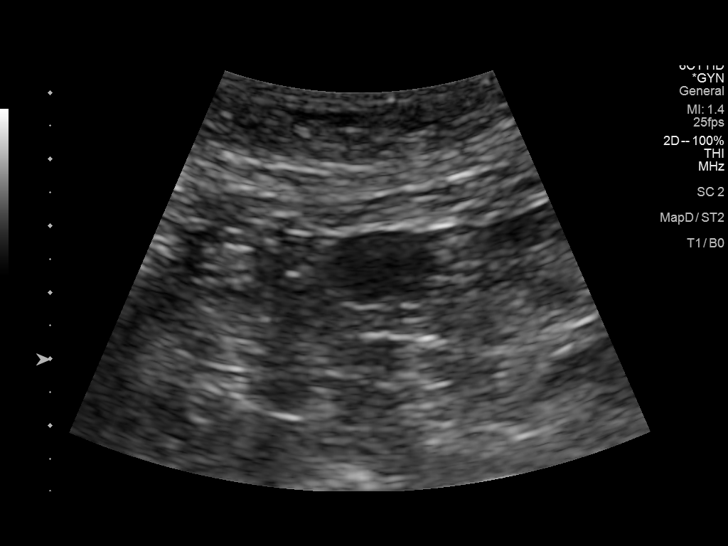
[im 22/30]
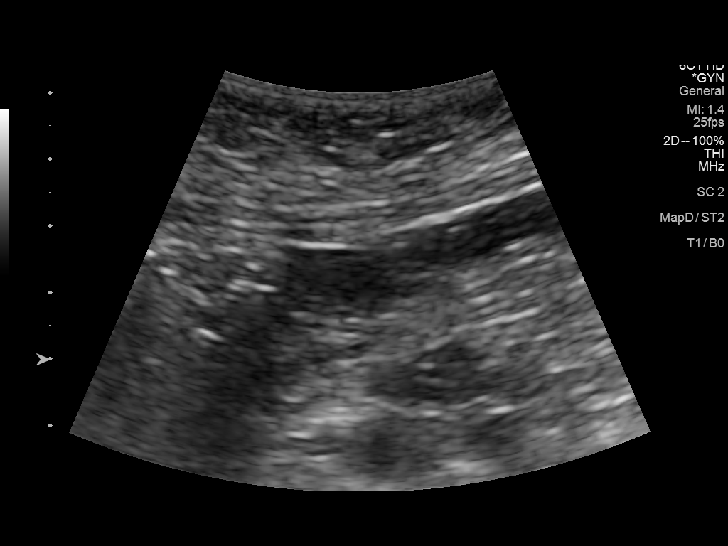
[im 25/30]
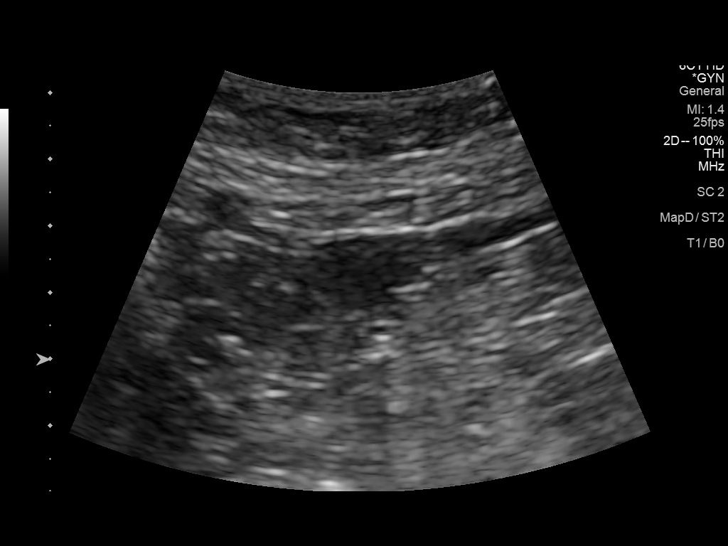
[im 27/30]
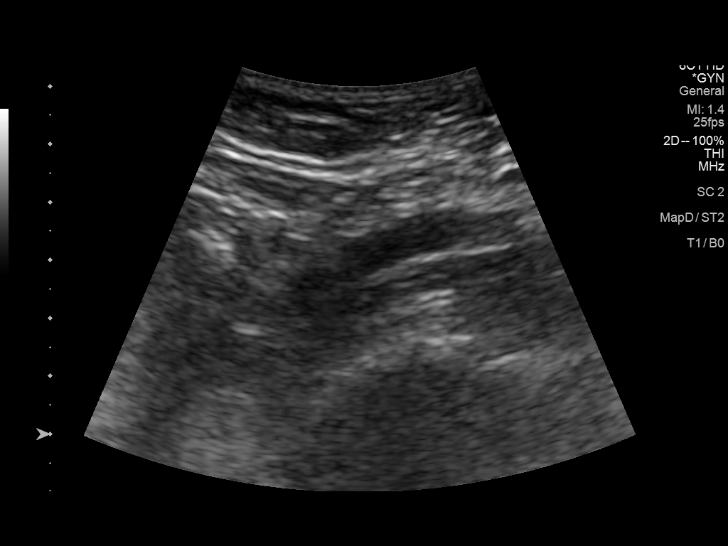
[im 30/30]
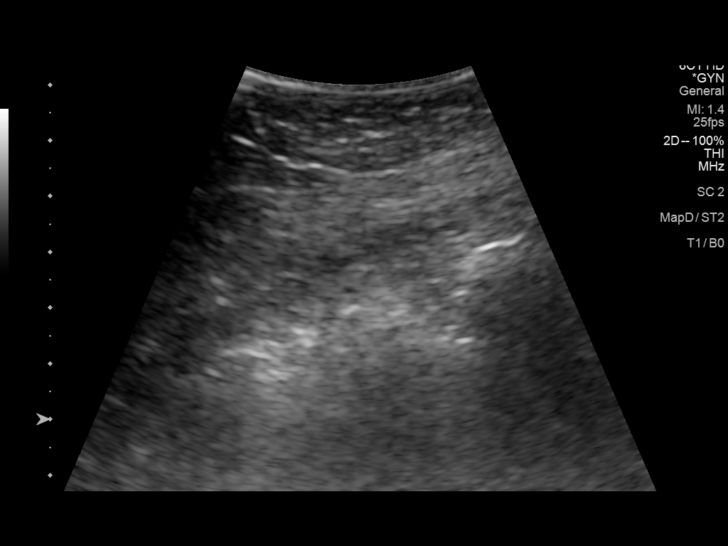

[14 of 25 positions shown; findings below may reference images not displayed]

FINDINGS: Uterus

Measurements: 5.5 x 2.5 x 3.1 cm = volume: 22.4 mL. Grossly normal
in morphology. Suboptimally evaluated secondary to transabdominal
probe and other technique related factors.

Endometrium

Thickness: Normal, 2 mm.  No focal abnormality visualized.

Right ovary

Measurements: 2.1 x 1.2 x 1.1 cm = volume: 1.5 mL. Normal
appearance/no adnexal mass.

Left ovary

Not visualized.  No adnexal mass.

Other findings:  No significant free fluid.
IMPRESSION: 1. Grossly normal appearance of the uterus. Normal appearance of the
right ovary.
2. Lack of visualization of the left ovary, without adnexal mass
identified.

## 2023-03-21 DIAGNOSIS — M818 Other osteoporosis without current pathological fracture: Secondary | ICD-10-CM | POA: Diagnosis not present

## 2023-03-28 DIAGNOSIS — M818 Other osteoporosis without current pathological fracture: Secondary | ICD-10-CM | POA: Diagnosis not present

## 2023-04-04 DIAGNOSIS — M818 Other osteoporosis without current pathological fracture: Secondary | ICD-10-CM | POA: Diagnosis not present

## 2023-04-11 DIAGNOSIS — M818 Other osteoporosis without current pathological fracture: Secondary | ICD-10-CM | POA: Diagnosis not present

## 2023-04-18 DIAGNOSIS — M818 Other osteoporosis without current pathological fracture: Secondary | ICD-10-CM | POA: Diagnosis not present

## 2023-05-19 DIAGNOSIS — E785 Hyperlipidemia, unspecified: Secondary | ICD-10-CM | POA: Diagnosis not present

## 2023-05-19 DIAGNOSIS — R0789 Other chest pain: Secondary | ICD-10-CM | POA: Diagnosis not present

## 2023-05-19 DIAGNOSIS — N3281 Overactive bladder: Secondary | ICD-10-CM | POA: Diagnosis not present

## 2023-05-19 DIAGNOSIS — I10 Essential (primary) hypertension: Secondary | ICD-10-CM | POA: Diagnosis not present

## 2023-05-19 DIAGNOSIS — E1169 Type 2 diabetes mellitus with other specified complication: Secondary | ICD-10-CM | POA: Diagnosis not present

## 2023-05-19 DIAGNOSIS — E119 Type 2 diabetes mellitus without complications: Secondary | ICD-10-CM | POA: Diagnosis not present

## 2023-05-20 ENCOUNTER — Other Ambulatory Visit (HOSPITAL_COMMUNITY): Payer: Self-pay | Admitting: Family Medicine

## 2023-05-20 DIAGNOSIS — R0789 Other chest pain: Secondary | ICD-10-CM

## 2023-06-03 ENCOUNTER — Other Ambulatory Visit (HOSPITAL_COMMUNITY): Payer: Medicare PPO

## 2023-06-12 ENCOUNTER — Ambulatory Visit (HOSPITAL_COMMUNITY)
Admission: RE | Admit: 2023-06-12 | Discharge: 2023-06-12 | Disposition: A | Discharge status: Home / Self Care | Payer: Medicare PPO | Source: Ambulatory Visit | Attending: Family Medicine | Admitting: Family Medicine

## 2023-06-12 DIAGNOSIS — R0789 Other chest pain: Secondary | ICD-10-CM | POA: Insufficient documentation

## 2023-07-10 DIAGNOSIS — I10 Essential (primary) hypertension: Secondary | ICD-10-CM | POA: Diagnosis not present

## 2023-07-10 DIAGNOSIS — G8929 Other chronic pain: Secondary | ICD-10-CM | POA: Diagnosis not present

## 2023-07-10 DIAGNOSIS — M810 Age-related osteoporosis without current pathological fracture: Secondary | ICD-10-CM | POA: Diagnosis not present

## 2023-07-10 DIAGNOSIS — K219 Gastro-esophageal reflux disease without esophagitis: Secondary | ICD-10-CM | POA: Diagnosis not present

## 2023-07-10 DIAGNOSIS — N3941 Urge incontinence: Secondary | ICD-10-CM | POA: Diagnosis not present

## 2023-07-10 DIAGNOSIS — E785 Hyperlipidemia, unspecified: Secondary | ICD-10-CM | POA: Diagnosis not present

## 2023-07-10 DIAGNOSIS — Z79899 Other long term (current) drug therapy: Secondary | ICD-10-CM | POA: Diagnosis not present

## 2023-07-10 DIAGNOSIS — J301 Allergic rhinitis due to pollen: Secondary | ICD-10-CM | POA: Diagnosis not present

## 2023-07-10 DIAGNOSIS — K581 Irritable bowel syndrome with constipation: Secondary | ICD-10-CM | POA: Diagnosis not present

## 2023-11-27 DIAGNOSIS — E785 Hyperlipidemia, unspecified: Secondary | ICD-10-CM | POA: Diagnosis not present

## 2023-11-27 DIAGNOSIS — E559 Vitamin D deficiency, unspecified: Secondary | ICD-10-CM | POA: Diagnosis not present

## 2023-11-27 DIAGNOSIS — Z79899 Other long term (current) drug therapy: Secondary | ICD-10-CM | POA: Diagnosis not present

## 2023-11-27 DIAGNOSIS — Z23 Encounter for immunization: Secondary | ICD-10-CM | POA: Diagnosis not present

## 2023-11-27 DIAGNOSIS — H6121 Impacted cerumen, right ear: Secondary | ICD-10-CM | POA: Diagnosis not present

## 2023-11-27 DIAGNOSIS — Z1331 Encounter for screening for depression: Secondary | ICD-10-CM | POA: Diagnosis not present

## 2023-11-27 DIAGNOSIS — M8588 Other specified disorders of bone density and structure, other site: Secondary | ICD-10-CM | POA: Diagnosis not present

## 2023-11-27 DIAGNOSIS — G5762 Lesion of plantar nerve, left lower limb: Secondary | ICD-10-CM | POA: Diagnosis not present

## 2023-11-27 DIAGNOSIS — I10 Essential (primary) hypertension: Secondary | ICD-10-CM | POA: Diagnosis not present

## 2023-11-27 DIAGNOSIS — E1169 Type 2 diabetes mellitus with other specified complication: Secondary | ICD-10-CM | POA: Diagnosis not present

## 2023-11-27 DIAGNOSIS — Z0000 Encounter for general adult medical examination without abnormal findings: Secondary | ICD-10-CM | POA: Diagnosis not present

## 2023-12-20 DIAGNOSIS — E785 Hyperlipidemia, unspecified: Secondary | ICD-10-CM | POA: Diagnosis not present

## 2023-12-20 DIAGNOSIS — E1169 Type 2 diabetes mellitus with other specified complication: Secondary | ICD-10-CM | POA: Diagnosis not present

## 2023-12-20 DIAGNOSIS — I10 Essential (primary) hypertension: Secondary | ICD-10-CM | POA: Diagnosis not present

## 2024-01-19 DIAGNOSIS — E1169 Type 2 diabetes mellitus with other specified complication: Secondary | ICD-10-CM | POA: Diagnosis not present

## 2024-01-19 DIAGNOSIS — I10 Essential (primary) hypertension: Secondary | ICD-10-CM | POA: Diagnosis not present

## 2024-01-19 DIAGNOSIS — E785 Hyperlipidemia, unspecified: Secondary | ICD-10-CM | POA: Diagnosis not present

## 2024-02-19 DIAGNOSIS — E1169 Type 2 diabetes mellitus with other specified complication: Secondary | ICD-10-CM | POA: Diagnosis not present

## 2024-02-19 DIAGNOSIS — I10 Essential (primary) hypertension: Secondary | ICD-10-CM | POA: Diagnosis not present

## 2024-02-19 DIAGNOSIS — E785 Hyperlipidemia, unspecified: Secondary | ICD-10-CM | POA: Diagnosis not present

## 2024-03-11 DIAGNOSIS — Z1231 Encounter for screening mammogram for malignant neoplasm of breast: Secondary | ICD-10-CM | POA: Diagnosis not present

## 2024-03-21 DIAGNOSIS — E1169 Type 2 diabetes mellitus with other specified complication: Secondary | ICD-10-CM | POA: Diagnosis not present

## 2024-03-21 DIAGNOSIS — I10 Essential (primary) hypertension: Secondary | ICD-10-CM | POA: Diagnosis not present

## 2024-03-21 DIAGNOSIS — E785 Hyperlipidemia, unspecified: Secondary | ICD-10-CM | POA: Diagnosis not present

## 2024-04-20 DIAGNOSIS — E1169 Type 2 diabetes mellitus with other specified complication: Secondary | ICD-10-CM | POA: Diagnosis not present

## 2024-04-20 DIAGNOSIS — I10 Essential (primary) hypertension: Secondary | ICD-10-CM | POA: Diagnosis not present

## 2024-04-20 DIAGNOSIS — E785 Hyperlipidemia, unspecified: Secondary | ICD-10-CM | POA: Diagnosis not present

## 2024-05-08 DIAGNOSIS — Z8249 Family history of ischemic heart disease and other diseases of the circulatory system: Secondary | ICD-10-CM | POA: Diagnosis not present

## 2024-05-08 DIAGNOSIS — M810 Age-related osteoporosis without current pathological fracture: Secondary | ICD-10-CM | POA: Diagnosis not present

## 2024-05-08 DIAGNOSIS — E785 Hyperlipidemia, unspecified: Secondary | ICD-10-CM | POA: Diagnosis not present

## 2024-05-08 DIAGNOSIS — Z833 Family history of diabetes mellitus: Secondary | ICD-10-CM | POA: Diagnosis not present

## 2024-05-08 DIAGNOSIS — N3941 Urge incontinence: Secondary | ICD-10-CM | POA: Diagnosis not present

## 2024-05-08 DIAGNOSIS — G8929 Other chronic pain: Secondary | ICD-10-CM | POA: Diagnosis not present

## 2024-05-08 DIAGNOSIS — I10 Essential (primary) hypertension: Secondary | ICD-10-CM | POA: Diagnosis not present

## 2024-05-08 DIAGNOSIS — Z809 Family history of malignant neoplasm, unspecified: Secondary | ICD-10-CM | POA: Diagnosis not present

## 2024-05-11 DIAGNOSIS — R350 Frequency of micturition: Secondary | ICD-10-CM | POA: Diagnosis not present

## 2024-05-11 DIAGNOSIS — R109 Unspecified abdominal pain: Secondary | ICD-10-CM | POA: Diagnosis not present

## 2024-05-21 DIAGNOSIS — I10 Essential (primary) hypertension: Secondary | ICD-10-CM | POA: Diagnosis not present

## 2024-05-21 DIAGNOSIS — E785 Hyperlipidemia, unspecified: Secondary | ICD-10-CM | POA: Diagnosis not present

## 2024-05-21 DIAGNOSIS — E1169 Type 2 diabetes mellitus with other specified complication: Secondary | ICD-10-CM | POA: Diagnosis not present

## 2024-05-28 DIAGNOSIS — I10 Essential (primary) hypertension: Secondary | ICD-10-CM | POA: Diagnosis not present

## 2024-05-28 DIAGNOSIS — E1169 Type 2 diabetes mellitus with other specified complication: Secondary | ICD-10-CM | POA: Diagnosis not present

## 2024-05-28 DIAGNOSIS — Z79899 Other long term (current) drug therapy: Secondary | ICD-10-CM | POA: Diagnosis not present

## 2024-05-28 DIAGNOSIS — E785 Hyperlipidemia, unspecified: Secondary | ICD-10-CM | POA: Diagnosis not present

## 2024-05-28 DIAGNOSIS — R011 Cardiac murmur, unspecified: Secondary | ICD-10-CM | POA: Diagnosis not present

## 2024-06-19 DIAGNOSIS — N390 Urinary tract infection, site not specified: Secondary | ICD-10-CM | POA: Diagnosis not present

## 2024-06-20 DIAGNOSIS — I10 Essential (primary) hypertension: Secondary | ICD-10-CM | POA: Diagnosis not present

## 2024-06-20 DIAGNOSIS — E785 Hyperlipidemia, unspecified: Secondary | ICD-10-CM | POA: Diagnosis not present

## 2024-06-20 DIAGNOSIS — E1169 Type 2 diabetes mellitus with other specified complication: Secondary | ICD-10-CM | POA: Diagnosis not present

## 2024-07-22 ENCOUNTER — Emergency Department (HOSPITAL_COMMUNITY)
Admission: EM | Admit: 2024-07-22 | Discharge: 2024-07-22 | Discharge status: Against Medical Advice | Attending: Emergency Medicine | Admitting: Emergency Medicine

## 2024-07-22 DIAGNOSIS — N390 Urinary tract infection, site not specified: Secondary | ICD-10-CM | POA: Diagnosis present

## 2024-07-22 DIAGNOSIS — Z5321 Procedure and treatment not carried out due to patient leaving prior to being seen by health care provider: Secondary | ICD-10-CM | POA: Diagnosis not present

## 2024-07-22 LAB — URINALYSIS, ROUTINE W REFLEX MICROSCOPIC
Bacteria, UA: NONE SEEN
Bilirubin Urine: NEGATIVE
Glucose, UA: NEGATIVE mg/dL
Hgb urine dipstick: NEGATIVE
Ketones, ur: NEGATIVE mg/dL
Leukocytes,Ua: NEGATIVE
Nitrite: POSITIVE — AB
Protein, ur: NEGATIVE mg/dL
Specific Gravity, Urine: 1.023 (ref 1.005–1.030)
pH: 5 (ref 5.0–8.0)

## 2024-07-22 NOTE — ED Triage Notes (Signed)
 Patient reports she has been dealing with UTI 6 weeks ago Finished antibiotics 8-10 days ago  Says she woke up today and symptoms are back Pain is 4/10

## 2024-07-23 ENCOUNTER — Ambulatory Visit (HOSPITAL_COMMUNITY): Payer: Self-pay
